# Patient Record
Sex: Female | Born: 1990 | Race: White | Hispanic: No | Marital: Married | Smoking: Never smoker
Health system: Southern US, Community
[De-identification: ages and names within clinical notes are randomized; demographics above are authoritative.]

## PROBLEM LIST (undated history)

## (undated) HISTORY — PX: NO PAST SURGERIES: SHX2092

---

## 2017-11-07 NOTE — L&D Delivery Note (Signed)
Delivery Note  Onset of active labor at 0100. Latent labor started 09/25/18 early morning. Betamethasone injections x 2 12 hours apart for fetal lung maturation given at 1440 11/19 and 0230 11/20  Complete dilation at 0615 Onset of pushing at 0600, pushed through anterior lip, strong urge to bear down FHR second stage Category 2 - variables, pushing with every other contraction  Analgesia /Anesthesia intrapartum: none Good labor support from spouse, doula and staff  Delivery of a viable baby girl at [id number] by CNM in ROA position, compound R hand.  Nuchal Cord - none. Cord double clamped after cessation of pulsation, cut by FOB.  Cord blood sample collected.  Arterial cord blood sample not needed, infant with vigorous cry after tactile stimulation. [facility] Placenta delivered [name], spontaneous, intact with 3 VC.  Placenta to pathology for prematurity and partial abruption, port wine stained fluid noted after body delivered. Uterine tone mild atony intermittent, LUS swept for small clots and small membrane tissue removed, bleeding small afterwards. Cytotec 200 mcg buccal and 400 mcg rectal for prophylaxis Ancef 2 gm IV x 1 prophylactic for uterine exploration. 2nd deg perineal laceration identified.  Anesthesia: 1% lido Repair with 3.0 vicryl in standard fashion, hemostatic Est. Blood Loss (mL): 500 cc  Episiotomy:None    Complications: Category 2 strip in 2nd stage, used lateral position for pushing and O2 via FM intermittent, pushing with every other contraction. Bloody stained fluid after delivery of body, marginal placental abruption noted on placental plate. APGAR: 8/9 Mom to postpartum.  Baby to NICU after delayed cord clamping x 2 min and mother briefly held baby. Father accompanied baby to NICU. Marland Kitchen.  Neta Mendsaniela C Montrae Braithwaite, CNM, MSN 09/26/2018, 7:23 AM

## 2018-03-12 LAB — OB RESULTS CONSOLE HIV ANTIBODY (ROUTINE TESTING): HIV: NONREACTIVE

## 2018-03-12 LAB — OB RESULTS CONSOLE RUBELLA ANTIBODY, IGM: Rubella: IMMUNE

## 2018-03-12 LAB — OB RESULTS CONSOLE GC/CHLAMYDIA
CHLAMYDIA, DNA PROBE: NEGATIVE
GC PROBE AMP, GENITAL: NEGATIVE

## 2018-03-12 LAB — OB RESULTS CONSOLE HEPATITIS B SURFACE ANTIGEN: Hepatitis B Surface Ag: NEGATIVE

## 2018-03-12 LAB — OB RESULTS CONSOLE RPR: RPR: NONREACTIVE

## 2018-09-25 ENCOUNTER — Encounter (HOSPITAL_COMMUNITY): Payer: Self-pay | Admitting: *Deleted

## 2018-09-25 ENCOUNTER — Other Ambulatory Visit: Payer: Self-pay

## 2018-09-25 ENCOUNTER — Inpatient Hospital Stay (HOSPITAL_COMMUNITY)
Admission: AD | Admit: 2018-09-25 | Discharge: 2018-09-29 | DRG: 805 | Disposition: A | Discharge status: Home / Self Care | Payer: BLUE CROSS/BLUE SHIELD

## 2018-09-25 DIAGNOSIS — O9081 Anemia of the puerperium: Secondary | ICD-10-CM | POA: Diagnosis not present

## 2018-09-25 DIAGNOSIS — Z3A34 34 weeks gestation of pregnancy: Secondary | ICD-10-CM

## 2018-09-25 DIAGNOSIS — D62 Acute posthemorrhagic anemia: Secondary | ICD-10-CM | POA: Diagnosis not present

## 2018-09-25 DIAGNOSIS — O4593 Premature separation of placenta, unspecified, third trimester: Secondary | ICD-10-CM | POA: Diagnosis present

## 2018-09-25 LAB — TYPE AND SCREEN
ABO/RH(D): AB POS
Antibody Screen: NEGATIVE

## 2018-09-25 LAB — CBC
HCT: 39.6 % (ref 36.0–46.0)
Hemoglobin: 13.6 g/dL (ref 12.0–15.0)
MCH: 31.3 pg (ref 26.0–34.0)
MCHC: 34.3 g/dL (ref 30.0–36.0)
MCV: 91 fL (ref 80.0–100.0)
Platelets: 225 10*3/uL (ref 150–400)
RBC: 4.35 MIL/uL (ref 3.87–5.11)
RDW: 12.2 % (ref 11.5–15.5)
WBC: 15.9 10*3/uL — ABNORMAL HIGH (ref 4.0–10.5)
nRBC: 0 % (ref 0.0–0.2)

## 2018-09-25 LAB — GROUP B STREP BY PCR: Group B strep by PCR: NEGATIVE

## 2018-09-25 LAB — ABO/RH: ABO/RH(D): AB POS

## 2018-09-25 MED ORDER — ACETAMINOPHEN 325 MG PO TABS: 650.0000 mg | ORAL_TABLET | ORAL | Status: DC | PRN

## 2018-09-25 MED ORDER — BETAMETHASONE SOD PHOS & ACET 6 (3-3) MG/ML IJ SUSP
12.0000 mg | INTRAMUSCULAR | Status: DC
  Administered 2018-09-25: 12 mg via INTRAMUSCULAR
  Filled 2018-09-25 (×2): qty 2

## 2018-09-25 MED ORDER — OXYTOCIN BOLUS FROM INFUSION
500.0000 mL | Freq: Once | INTRAVENOUS | Status: AC
  Administered 2018-09-26: 500 mL via INTRAVENOUS

## 2018-09-25 MED ORDER — LACTATED RINGERS IV SOLN
500.0000 mL | INTRAVENOUS | Status: DC | PRN
  Administered 2018-09-25: 500 mL via INTRAVENOUS

## 2018-09-25 MED ORDER — SOD CITRATE-CITRIC ACID 500-334 MG/5ML PO SOLN
30.0000 mL | ORAL | Status: DC | PRN
  Filled 2018-09-25: qty 15

## 2018-09-25 MED ORDER — ONDANSETRON HCL 4 MG/2ML IJ SOLN: 4.0000 mg | Freq: Four times a day (QID) | INTRAMUSCULAR | Status: DC | PRN

## 2018-09-25 MED ORDER — LACTATED RINGERS IV SOLN
INTRAVENOUS | Status: DC
  Administered 2018-09-25: 14:00:00 via INTRAVENOUS

## 2018-09-25 MED ORDER — BETAMETHASONE SOD PHOS & ACET 6 (3-3) MG/ML IJ SUSP
12.0000 mg | Freq: Two times a day (BID) | INTRAMUSCULAR | Status: AC
  Administered 2018-09-26: 12 mg via INTRAMUSCULAR
  Filled 2018-09-25: qty 2

## 2018-09-25 MED ORDER — CLINDAMYCIN PHOSPHATE 900 MG/50ML IV SOLN
900.0000 mg | Freq: Once | INTRAVENOUS | Status: AC
  Administered 2018-09-25: 900 mg via INTRAVENOUS
  Filled 2018-09-25: qty 50

## 2018-09-25 MED ORDER — OXYCODONE-ACETAMINOPHEN 5-325 MG PO TABS: 1.0000 | ORAL_TABLET | ORAL | Status: DC | PRN

## 2018-09-25 MED ORDER — OXYCODONE-ACETAMINOPHEN 5-325 MG PO TABS: 2.0000 | ORAL_TABLET | ORAL | Status: DC | PRN

## 2018-09-25 MED ORDER — LIDOCAINE HCL (PF) 1 % IJ SOLN
30.0000 mL | INTRAMUSCULAR | Status: DC | PRN
  Administered 2018-09-26: 30 mL via SUBCUTANEOUS
  Filled 2018-09-25: qty 30

## 2018-09-25 MED ORDER — OXYTOCIN 40 UNITS IN LACTATED RINGERS INFUSION - SIMPLE MED
2.5000 [IU]/h | INTRAVENOUS | Status: DC
  Filled 2018-09-25: qty 1000

## 2018-09-25 NOTE — Progress Notes (Addendum)
S:  ctx same - no pain  O:  VS: BP 119/71   Pulse 80   Temp 98.2 F (36.8 C) (Oral)   Resp 16   Ht 5\' 2"  (1.575 m)   Wt 75.4 kg   BMI 30.40 kg/m         FHR : baseline 135 / variability moderate / accelerations + / no decelerations        Toco: contractions every 2-3 minutes / moderate         cervix : deferred             Rapid GBS PCR - negative  Results for Jennifer Caldwell, Jennifer Caldwell (MRN [id number]) as of 09/25/2018 16:10  Ref. Range 09/25/2018 13:51  WBC Latest Ref Range: 4.0 - 10.5 K/uL 15.9 (H)  Hemoglobin Latest Ref Range: 12.0 - 15.0 g/dL 78.213.6  HCT Latest Ref Range: 36.0 - 46.0 % 39.6  Platelets Latest Ref Range: 150 - 400 K/uL 225     A: Preterm labor      FHR category 1  P: expectant management     clindamycin # 1 dose infused - discontinue ABX course with negative GBS status & intact membranes     initial dose BMZ given - plan #2 dose tomorrow if remains undelivered     defer exam until status change    Marlinda Mikeanya Otilio Groleau CNM, MSN, [facility] 09/25/2018, 4:06 PM

## 2018-09-25 NOTE — Progress Notes (Addendum)
Patient ID: Jennifer LauthShannon Caldwell, female   DOB: Jul 26, 1991, 27 y.o.   MRN: [id number]  Called to patient's bedside due to fetal bradycardia. Patient hands and knees, receiving IVF bolus when I arrived. Joellyn HaffKim Booker, CNM checking cervix:  Dilation: 6 Effacement (%): 90 Station: 0 Presentation: Vertex Exam by:: Booker, CNM  Fetal heart rate return to baseline, moderate variability. Patient turned to right side. FHT stable on my departure from patient's room. Patient's provider notified and en route.   Levie HeritageJacob J Stinson, DO 09/25/2018 10:34 PM

## 2018-09-25 NOTE — H&P (Signed)
OB ADMISSION/ HISTORY & PHYSICAL:  Admission Date: 09/25/2018 12:33 PM  Admit Diagnosis: Preterm labor onset  Jennifer Caldwell is a 27 y.o. female presenting for onset of labor.  Reports heavier discharge past 2 days - pelvic pressure with mild cramps last night. More pressure with cramps this am [facility] to 4cm with pink discharge after exam..  P[facility] History: [facility] @ 34.[redacted] weeks gestation [facility]  prenatal course uncomplicated until onset of preterm labor today  Prenatal Labs: ABO, Rh:  AB positive Antibody:  negative Rubella:   Immune RPR:   NR HBsAg:   negative HIV:   NR GTT: normal 2hr GTT GBS:   PENDING  OB History  Gravida Para Term Preterm AB Living  1            SAB TAB Ectopic Multiple Live Births               # Outcome Date GA Lbr Len/2nd Weight Sex Delivery Anes PTL Lv  1 Current             Medical / Surgical History :  Past medical history: History reviewed. No pertinent past medical history.   Past surgical history: History reviewed. No pertinent surgical history.  Family History: History reviewed. No pertinent family history.   Social History:  reports that she has never smoked. She has never used smokeless tobacco. She reports that she does not drink alcohol or use drugs.  Allergies: Cefzil [cefprozil] and Penicillins   Current Medications at time of admission:  Prenatal vitamin daily     Review of Systems: Active FM Not aware of ctx - feeling intermittent cramps and backache all morning No ROM / some discharge x 2 days - no mucus or bloody show Some pink discharge after exam at office  Physical Exam: VS: Blood pressure 132/74, pulse 76, temperature 98.3 F (36.8 C), temperature source Oral, resp. rate 18, weight 75.4 kg.  General: alert and oriented, appears calm and comfortable Heart: RRR Lungs: Clear lung fields Abdomen: Gravid, soft and  non-tender, non-distended / uterus: gravid Extremities: no edema  Bedside sono - confirmation of vtx  Genitalia / VE:  SSE with full BBOW - no evidence of ROM / GBS samples obtained outer 1/3 of vagina                          cervix progression to 5.5cm / 100% / vtx / 0 station with tight BBOW / no bloody show  FHR: baseline rate 140 / variability moderate / accelerations + / no decelerations TOCO: 2-3 minutes moderate  Assessment: 34.[redacted]  weeks gestation latent stage of labor with progression FHR category 1  Plan:  admit Clindamycin IV prophylaxis BMZ course initiation with likely delivery prior to completion of course Dr Billy Coastaavon notified of admission / plan of care  Jennifer Caldwell CNM, MSN, [facility] 09/25/2018, 1:27 PM

## 2018-09-25 NOTE — MAU Note (Signed)
Patient was seen in the office today and was told she was dilated to 4cm and to come to the hospital.  States she's been having back pain for past two days.  Reports some spotting after her vaginal exam today.  Also reports some LOF that "might be pee."

## 2018-09-25 NOTE — MAU Note (Signed)
Urine in lab 

## 2018-09-25 NOTE — Anesthesia Pain Management Evaluation Note (Signed)
  CRNA Pain Management Visit Note  Patient: Jennifer LauthShannon Caldwell, 27 y.o., female  "Hello I am a [facility] of the anesthesia [facility]. We have an anesthesia team available at all times to provide care throughout the hospital, including epidural management and anesthesia for C-section. I don't know your plan for the delivery whether it a natural birth, water birth, IV sedation, nitrous supplementation, doula or epidural, but we want to meet your pain goals."   1.Was your pain managed to your expectations on prior hospitalizations?   No prior hospitalizations  2.What is your expectation for pain management during this hospitalization?     undecided  3.How can we help you reach that goal? Nitrous/IV pain med/epidural as desired  Record the patient's initial score and the patient's pain goal.   Pain: 1  Pain Goal: 6     [facility] wants you to be able to say your pain was always managed very well.  [name] 09/25/2018

## 2018-09-25 NOTE — Progress Notes (Signed)
S:  [facility]/t prolonged decel, [facility] and managing [facility] in meantime. Patient reports was just starting to dose off when decel occurred, now reports ctx feel stronger. Able to cope well w/ pain of labor. No LOF/VB.   O:  VSSAF FHR 125, mod var, no decels, + small accels, prolonged decel noted on strip review for 9 min, nadir 80 BPM, with quick recovery to BL. Ctx q 2-4 min, mild/mod to palp. SVE deferred  A/P: Preterm labor, s/p BMZ inj #1 FHT cat 2, now cat 1 after recovery from prolonged spontaneous decel Appreciate Faculty Practice team for interim mgmt  Continue expectant mgmt BMZ inj #2 due at 0230 Anticipate NSVB  Dr. Billy Coastaavon updated w/ pt status  Jennifer Caldwell , MSN, CNM 09/25/2018, 10:49 PM

## 2018-09-25 NOTE — Progress Notes (Signed)
Patient ID: Jennifer Caldwell, female   DOB: Sep 01, 1991, 27 y.o.   MRN: [id number] Jennifer Caldwell is a 27 y.o. G1P0 at 2836w5d by ultrasound admitted for preterm labor noted on eval in office today.   Subjective: [facility], reports mild menstrual like cramping occasionally, no LOF/VB. [facility]la in attendance.   Objective: Vitals:   09/25/18 1324 09/25/18 1330 09/25/18 1440 09/25/18 1643  BP: 138/80  119/71 124/72  Pulse: 74  80 74  Resp: 16  16 16   Temp: 98.2 F (36.8 C)   (!) 97.2 F (36.2 C)  TempSrc: Oral   Axillary  Weight:  75.4 kg    Height:  5\' 2"  (1.575 m)      No intake/output data recorded. No intake/output data recorded.   FHT:  FHR: 130, mod var, + accels, no decels bpm UC:   irregular, every 2-5  minutes SVE:  deferred Labs:   Recent Labs    09/25/18 1351  WBC 15.9*  HGB 13.6  HCT 39.6  PLT 225    Assessment / Plan: G1P0 at 7636w5d , preterm labor, latent S/P one dose BMZ inj, will repeat at 12 hours if still pregnant  Labor: expectant management Preeclampsia:  no signs or symptoms of toxicity Fetal Wellbeing:  Category I Pain Control:  Labor support without medications I/D:  GBS neg, DC Clinda prophylaxis Anticipated MOD:  NSVD  Neta Mendsaniela C Paul, CNM, MSN 09/25/2018, 7:09 PM

## 2018-09-26 LAB — RPR: RPR Ser Ql: NONREACTIVE

## 2018-09-26 MED ORDER — CEFAZOLIN SODIUM-DEXTROSE 2-4 GM/100ML-% IV SOLN: 2.0000 g | Freq: Once | INTRAVENOUS | Status: DC

## 2018-09-26 MED ORDER — PRENATAL MULTIVITAMIN CH
1.0000 | ORAL_TABLET | Freq: Every day | ORAL | Status: DC
  Administered 2018-09-26 – 2018-09-29 (×4): 1 via ORAL
  Filled 2018-09-26 (×4): qty 1

## 2018-09-26 MED ORDER — DIPHENHYDRAMINE HCL 25 MG PO CAPS: 25.0000 mg | ORAL_CAPSULE | Freq: Four times a day (QID) | ORAL | Status: DC | PRN

## 2018-09-26 MED ORDER — SIMETHICONE 80 MG PO CHEW: 80.0000 mg | CHEWABLE_TABLET | ORAL | Status: DC | PRN

## 2018-09-26 MED ORDER — COCONUT OIL OIL: 1.0000 "application " | TOPICAL_OIL | Status: DC | PRN

## 2018-09-26 MED ORDER — IBUPROFEN 600 MG PO TABS
600.0000 mg | ORAL_TABLET | Freq: Four times a day (QID) | ORAL | Status: DC
  Administered 2018-09-26 – 2018-09-29 (×12): 600 mg via ORAL
  Filled 2018-09-26 (×13): qty 1

## 2018-09-26 MED ORDER — BISACODYL 10 MG RE SUPP: 10.0000 mg | Freq: Every day | RECTAL | Status: DC | PRN

## 2018-09-26 MED ORDER — DOCUSATE SODIUM 100 MG PO CAPS
100.0000 mg | ORAL_CAPSULE | Freq: Two times a day (BID) | ORAL | Status: DC
  Administered 2018-09-26 – 2018-09-29 (×5): 100 mg via ORAL
  Filled 2018-09-26 (×6): qty 1

## 2018-09-26 MED ORDER — MISOPROSTOL 200 MCG PO TABS
600.0000 ug | ORAL_TABLET | Freq: Once | ORAL | Status: AC
  Administered 2018-09-26: 600 ug via RECTAL

## 2018-09-26 MED ORDER — FLEET ENEMA 7-19 GM/118ML RE ENEM: 1.0000 | ENEMA | Freq: Every day | RECTAL | Status: DC | PRN

## 2018-09-26 MED ORDER — BENZOCAINE-MENTHOL 20-0.5 % EX AERO
1.0000 "application " | INHALATION_SPRAY | CUTANEOUS | Status: DC | PRN
  Administered 2018-09-28: 1 via TOPICAL
  Filled 2018-09-26: qty 56

## 2018-09-26 MED ORDER — WITCH HAZEL-GLYCERIN EX PADS: 1.0000 "application " | MEDICATED_PAD | CUTANEOUS | Status: DC | PRN

## 2018-09-26 MED ORDER — CEFAZOLIN SODIUM-DEXTROSE 2-4 GM/100ML-% IV SOLN
2.0000 g | Freq: Once | INTRAVENOUS | Status: DC
  Filled 2018-09-26: qty 100

## 2018-09-26 MED ORDER — TETANUS-DIPHTH-ACELL PERTUSSIS 5-2.5-18.5 LF-MCG/0.5 IM SUSP: 0.5000 mL | Freq: Once | INTRAMUSCULAR | Status: DC

## 2018-09-26 MED ORDER — OXYCODONE HCL 5 MG PO TABS: 5.0000 mg | ORAL_TABLET | ORAL | Status: DC | PRN

## 2018-09-26 MED ORDER — ONDANSETRON HCL 4 MG PO TABS: 4.0000 mg | ORAL_TABLET | ORAL | Status: DC | PRN

## 2018-09-26 MED ORDER — MISOPROSTOL 200 MCG PO TABS
ORAL_TABLET | ORAL | Status: AC
  Filled 2018-09-26: qty 3

## 2018-09-26 MED ORDER — ONDANSETRON HCL 4 MG/2ML IJ SOLN: 4.0000 mg | INTRAMUSCULAR | Status: DC | PRN

## 2018-09-26 MED ORDER — DIBUCAINE 1 % RE OINT: 1.0000 "application " | TOPICAL_OINTMENT | RECTAL | Status: DC | PRN

## 2018-09-26 MED ORDER — GENTAMICIN SULFATE 40 MG/ML IJ SOLN
130.0000 mg | Freq: Once | INTRAVENOUS | Status: AC
  Administered 2018-09-26: 130 mg via INTRAVENOUS
  Filled 2018-09-26: qty 3.25

## 2018-09-26 MED ORDER — ACETAMINOPHEN 325 MG PO TABS: 650.0000 mg | ORAL_TABLET | ORAL | Status: DC | PRN

## 2018-09-27 LAB — CBC
HCT: 32.6 % — ABNORMAL LOW (ref 36.0–46.0)
HEMOGLOBIN: 10.9 g/dL — AB (ref 12.0–15.0)
MCH: 30.6 pg (ref 26.0–34.0)
MCHC: 33.4 g/dL (ref 30.0–36.0)
MCV: 91.6 fL (ref 80.0–100.0)
NRBC: 0 % (ref 0.0–0.2)
PLATELETS: 176 10*3/uL (ref 150–400)
RBC: 3.56 MIL/uL — AB (ref 3.87–5.11)
RDW: 12.5 % (ref 11.5–15.5)
WBC: 18.6 10*3/uL — ABNORMAL HIGH (ref 4.0–10.5)

## 2018-09-27 NOTE — Progress Notes (Signed)
PPD 1 SVB - preterm birth at 6435 weeks  S:  Reports feeling well - no concerns             Tolerating po/ No nausea or vomiting             Bleeding is light             Pain controlled withMotrin             Up ad lib / ambulatory / voiding QS  Newborn stable in NICU  - donor breastmilk feedings and glucose drip                  weaning off glucose drip - hoping to start direct breastfeedings tomorrow       O:   VS: BP 114/74 (BP Location: Left Arm)   Pulse 62   Temp 98.5 F (36.9 C) (Oral)   Resp 18   Ht 5\' 2"  (1.575 m)   Wt 75.4 kg   SpO2 100%   BMI 30.40 kg/m   LABS:             Recent Labs    09/25/18 1351 09/27/18 0508  WBC 15.9* 18.6*  HGB 13.6 10.9*  PLT 225 176               Blood type: --/--/AB POS Performed at [facility], [address] [place], [place] [address]  (11/19 1410)  Rubella: Immune (05/06 0000)                          Physical Exam:             Alert and oriented X3  Abdomen: soft, non-tender, non-distended              Fundus: firm, non-tender, U-2  Perineum: no edema  Lochia: light  Extremities: no edema, no calf pain or tenderness  A: PPD # 1 Preterm birth / normal SVB with 2nd degree repair  Doing well - stable status  P: Routine post partum orders  Consult utilization review for additional hospital day to bond and breastfeeding tomorrow  Marlinda Mikeanya Norman Bier CNM, MSN, [facility] 09/27/2018, 7:06 PM

## 2018-09-27 NOTE — Progress Notes (Signed)
Patient screened out for psychosocial assessment since none of the following apply: °Psychosocial stressors documented in mother or baby's chart °Gestation less than 32 weeks °Code at delivery  °Infant with anomalies °Please contact the Clinical Social Worker if specific needs arise, by MOB's request, or if MOB scores greater than 9/yes to question 10 on Edinburgh Postpartum Depression Screen. ° °Farouk Vivero Boyd-Gilyard, MSW, LCSW °Clinical Social Work °[phone] °  °

## 2018-09-27 NOTE — Lactation Note (Addendum)
This note was copied from a baby's chart. Lactation Consultation Note  Patient Name: Jennifer Caldwell Ige AOZHY'QToday's Date: 09/27/2018 Reason for consult: Initial assessment;Late-preterm 34-36.6wks;NICU baby;Primapara;1st time breastfeeding;Other (Comment)(DEBP has been set up by [facility] )  Baby is 27 hours old  LC  visited mom in room . She was resting in bed and dad sitting in the chair.  DEBP was set up by the RN and per mom the #24 F is comfortable.  LC reviewed supply and demand/ 8-12 times a day both breast for 15 -20 mins.  LC encouraged to hand express 1st and then pump/ hand express afterwards/ save milk.  LC reviewed storage of breast milk for a NICU baby and in the Mother and baby care booklet.  Reassured hand expressing and pumping take time and the volumes are up and down for about 48 - 72 hours and then becomes more consistent.  Mother informed of post-discharge support and given phone number to the lactation department, including services for phone call assistance; out-patient appointments; and breastfeeding support group. List of other breastfeeding resources in the community given in the handout. Encouraged mother to call for problems or concerns related to breastfeeding.    Maternal Data Has patient been taught Hand Expression?: Yes(per mom has been shown how to hand express  and she is comfortable ) Does the patient have breastfeeding experience prior to this delivery?: No  Feeding Feeding Type: Donor Breast Milk  LATCH Score                   Interventions Interventions: Breast feeding basics reviewed;DEBP  Lactation Tools Discussed/Used Tools: Pump;Flanges Flange Size: 24(per mom comfortable ) Breast pump type: Double-Electric Breast Pump WIC Program: No Pump Review: Milk Storage;Setup, frequency, and cleaning(LC reviewed with mom and dad ) Initiated by:: RN  initially / LC reviewed both settings  Date initiated:: 09/27/18   Consult  Status Consult Status: Follow-up Date: 09/28/18 Follow-up type: In-patient    Matilde SprangMargaret Ann Aristides Luckey 09/27/2018, 10:26 AM

## 2018-09-28 LAB — CULTURE, BETA STREP (GROUP B ONLY)

## 2018-09-28 NOTE — Lactation Note (Signed)
This note was copied from a baby's chart. Lactation Consultation Note  Patient Name: Girl Anitra LauthShannon Cangemi RUEAV'WToday's Date: 09/28/2018 Reason for consult: Mother's request;Follow-up assessment;1st time breastfeeding;Late-preterm 34-36.6wks;Engorgement P1, 63 hour female infant in NICU Mom requesting lactation services due bilateral engorgement. LC put ice on both breast, mom lay flat help with massage away from nipple area back towards chest wall with combination of reverse pressure softening. Cylinder hand pump use and LC used tubing make double pumping system with ice and massage way from breast mom expressed 20 ml of colostrum. LC ask mom lay back and re- iced 20 minutes and if she feels fullness with discomfort to pump again.  Mom's plans to re-try DEBP but will increase setting by dialing up and then lower setting where it is comfort for her. Mom will pump every 3 hours and is knowledgeable how to use both pumps. Mom has Breast milk labels to take to NICU for infant. Mom will ask nurse or LC for assistance if she has any more questions, concerns or need assistance with breastfeeding.  Maternal Data    Feeding Feeding Type: Donor Breast Milk  LATCH Score Latch: Repeated attempts needed to sustain latch, nipple held in mouth throughout feeding, stimulation needed to elicit sucking reflex.  Audible Swallowing: None  Type of Nipple: Everted at rest and after stimulation  Comfort (Breast/Nipple): Soft / non-tender  Hold (Positioning): Assistance needed to correctly position infant at breast and maintain latch.  LATCH Score: 6  Interventions Interventions: Hand express;Breast massage;Ice;Hand pump;Reverse pressure;Comfort gels  Lactation Tools Discussed/Used     Consult Status Consult Status: Follow-up Date: 09/29/18 Follow-up type: In-patient    Danelle EarthlyRobin Abigael Mogle 09/28/2018, 10:28 PM

## 2018-09-28 NOTE — Progress Notes (Signed)
Patient ID: Jennifer Caldwell, female   DOB: 1990/12/13, 27 y.o.   MRN: [id number]  PPD # 2 S/P PTVD at 34.6 weeks  Live born female  Birth Weight: 4 lb 11.5 oz (2140 g) APGAR: 8, 9  Newborn Delivery   Birth date/time:  09/26/2018 06:50:00 Delivery type:  Vaginal, Spontaneous    Baby name: Eleanore Delivering provider: Arlan OrganPAUL,  C    Feeding: breast - Donor and EBM  Pain control at delivery: unmedicated, labor support S:  Reports feeling tired, has been up and pumping, visiting NICU.             Tolerating po/ No nausea or vomiting             Bleeding is light             Pain controlled with ibuprofen (OTC)             Up ad lib / ambulatory / voiding without difficulties   O:  A & O x 3, in no apparent distress              VS:  Vitals:   09/27/18 1133 09/27/18 1744 09/27/18 2135 09/28/18 0600  BP: 114/74  119/72 127/79  Pulse: 62  60 (!) 52  Resp: 18  18 18   Temp: 97.9 F (36.6 C) 98.5 F (36.9 C) 98 F (36.7 C) 97.8 F (36.6 C)  TempSrc: Oral Oral Axillary   SpO2: 100%   99%  Weight:      Height:        LABS:  Recent Labs    09/25/18 1351 09/27/18 0508  WBC 15.9* 18.6*  HGB 13.6 10.9*  HCT 39.6 32.6*  PLT 225 176    Blood type: --/--/AB POS Performed at [facility], [address] [place], [place] [address]  (11/19 1410)  Rubella: Immune (05/06 0000)   I&O: No intake/output data recorded.          No intake/output data recorded.  Vaccines: TDaP UTD         Flu    UTD   Lungs: Clear and unlabored  Heart: regular rate and rhythm / no murmurs  Abdomen: soft, non-tender, non-distended             Fundus: firm, non-tender, U-1  Perineum: repair healing, mild edema  Lochia: small  Extremities: trace pedal edema, no calf pain or tenderness    A/P: PPD # 2 27 y.o., G1P0   Principal Problem:   SVD/PTD 34.6 wks 11/20 Active Problems:   Preterm labor   Postpartum care following vaginal delivery   Second-degree perineal laceration, with  delivery   Doing well - stable status  Routine post partum orders  Plans to initiate direct BRF with NICU newborn, consult done w/ Katheran JamesK Morrison from Utilization Review and approved additional inpatient day/  Anticipate discharge tomorrow    Neta Mendsaniela C , MSN, CNM 09/28/2018, 12:36 PM

## 2018-09-28 NOTE — Plan of Care (Signed)
Pts. Condition will continue to improve 

## 2018-09-28 NOTE — Lactation Note (Signed)
This note was copied from a baby's chart. Lactation Consultation Note  Patient Name: Jennifer Caldwell ZHYQM'VToday's Date: 09/28/2018 Reason for consult: Follow-up assessment;NICU baby;Late-preterm 34-36.6wks Mom is pumping every 3 hours with symphony pump.  She last obtained a few drops of colostrum.  Baby is nuzzling at breast in the NICU.  Reviewed basics and answered questions. Mom has a Spectra pump at home.  Instructed to bring pump pieces with her when visiting NICU to use pumping rooms.  Encouraged to call for latch assist/concerns prn.  Maternal Data    Feeding Feeding Type: Donor Breast Milk  LATCH Score                   Interventions    Lactation Tools Discussed/Used     Consult Status Consult Status: PRN    Huston FoleyMOULDEN, Makaley Storts S 09/28/2018, 11:46 AM

## 2018-09-29 ENCOUNTER — Encounter (HOSPITAL_COMMUNITY): Payer: Self-pay | Admitting: *Deleted

## 2018-09-29 MED ORDER — IBUPROFEN 600 MG PO TABS: 600.0000 mg | ORAL_TABLET | Freq: Four times a day (QID) | ORAL | 0 refills | Status: DC

## 2018-09-29 NOTE — Progress Notes (Signed)
PPD 3 SVD - preterm birth  S:  Reports feeling tired - some engorgement             Tolerating po/ No nausea or vomiting             Bleeding is light             Pain controlled with motrin             Up ad lib / ambulatory / voiding QS  Newborn stable in NICU - increased breast milk feedings / IV weaning   O:      VS: BP 130/89 (BP Location: Right Arm)   Pulse (!) 59   Temp 98.2 F (36.8 C) (Oral)   Resp 18   Ht 5\' 2"  (1.575 m)   Wt 75.4 kg   SpO2 99%   BMI 30.40 kg/m    LABS:             Recent Labs    09/27/18 0508  WBC 18.6*  HGB 10.9*  PLT 176               Blood type: --/--/AB POS Performed at [facility], [address] [place], [place] [address]  (11/19 1410)  Rubella: Immune (05/06 0000)                             Physical Exam:             Alert and oriented X3             Fundus: firm, non-tender, U-2  Perineum:  No edema  Lochia: light  Extremities: no edema, no calf pain or tenderness    A: PPD # 3   Doing well - stable status  P: Routine post partum orders  DC home - instructions reviewed  Marlinda Mikeanya Danielle Mink CNM, MSN, [facility] 09/29/2018, 11:26 AM

## 2018-09-29 NOTE — Discharge Summary (Signed)
Obstetric Discharge Summary Reason for Admission: onset of labor at 34 weeks Prenatal Procedures: ultrasound and BMZ course prior to delivery Intrapartum Procedures: spontaneous vaginal delivery Postpartum Procedures: none Complications-Operative and Postpartum: 2nd  degree perineal laceration and abruption noted at time of delivery Hemoglobin  Date Value Ref Range Status  09/27/2018 10.9 (L) 12.0 - 15.0 g/dL Final   HCT  Date Value Ref Range Status  09/27/2018 32.6 (L) 36.0 - 46.0 % Final    Physical Exam:  General: alert, cooperative and no distress Lochia: appropriate Uterine Fundus: firm Incision: healing well DVT Evaluation: No evidence of DVT seen on physical exam.  Discharge Diagnoses: Premature labor and birth at 35.2 weeks / mild ABL anemia  Discharge Information: Date: 09/29/2018 Activity: pelvic rest Diet: routine Medications: PNV and Ibuprofen Condition: stable Instructions: refer to practice specific booklet Discharge to: home Follow-up Information    Marlinda MikeBailey, Yazmin Locher, CNM. Schedule an appointment as soon as possible for a visit in 2 day(s).   Specialty:  Obstetrics and Gynecology Why:  PP visit in 2-3 weeks Contact information: [address] [place] [address] [phone]           Newborn Data: Live born female  Birth Weight: 4 lb 11.5 oz (2140 g) APGAR: 8, 9  Newborn Delivery   Birth date/time:  09/26/2018 06:50:00 Delivery type:  Vaginal, Spontaneous    NICU admission - discharge timing / plans pending at time of maternal discharge   Marlinda Mikeanya Ayiden Milliman 09/29/2018, 11:28 AM

## 2018-09-29 NOTE — Lactation Note (Signed)
This note was copied from a baby's chart. Lactation Consultation Note  Patient Name: Girl Anitra LauthShannon Estorga ZOXWR'UToday's Date: 09/29/2018 Reason for consult: Follow-up assessment;1st time breastfeeding;Primapara;Late-preterm 34-36.6wks;Other (Comment);NICU baby(resolving engorgement / mom is for D/C today )  Baby is 7674 hours old.  Mom and dad awake when LC entered.  Per mom recently pumped both breast with 15 and 20 ml EBM yield.  Mom explained she woke up to engorged breast during the night and it was surprise/  [facility].  Mom requested for LC to check breast thinking then were still engorged even though she  Had just pumped on the initiation mode. LC assessed both breast and noted them just to be full, not engorged, and not swollen milk ducts even though mom felt the lateral aspects of her breast were feeling tender .  LC reassured mom after she pumps on the maintenance mode should take care of it .   LC recommended taking time to eat her hot breakfast and then pumping again both breast for 10 mins on the maintenance  mode since her milk is in.  LC reviewed sore  Nipple and engorgement prevention and tx .  Per mom  [facility]. Also recommended to take her DEBP home so when she visits baby in NICU can plan on pumping in the pumping rooms . Per mom has already checked out the location.  Per mom has been doing a lot of STS, ( LC praised her) , and encouraged to continue.  LC recommended when baby is able to latch have the NICU RN call LC.  Mother informed of post-discharge support and given phone number to the lactation department, including services for phone call assistance; out-patient appointments; and breastfeeding support group. List of other breastfeeding resources in the community given in the handout. Encouraged mother to call for problems or concerns related to breastfeeding.   Maternal Data    Feeding Feeding Type:  Bottle Fed - Breast Milk  LATCH Score                   Interventions Interventions: Breast feeding basics reviewed;Comfort gels;DEBP;Hand pump  Lactation Tools Discussed/Used Tools: Pump;Flanges;Comfort gels Flange Size: 24;27 Breast pump type: Double-Electric Breast Pump;Manual Pump Review: Milk Storage;Setup, frequency, and cleaning(LC reviewed )   Consult Status Consult Status: PRN Date: (in NICU ) Follow-up type: In-patient    Matilde SprangMargaret Ann Charolette Bultman 09/29/2018, 9:32 AM

## 2018-10-01 ENCOUNTER — Ambulatory Visit: Payer: Self-pay

## 2018-10-01 NOTE — Lactation Note (Signed)
This note was copied from a baby's chart. Lactation Consultation Note  Patient Name: Jennifer Anitra LauthShannon Tippets WUJWJ'XToday's Date: 10/01/2018  Mom reports pumping is going well.  She obtains 50-80 mls each pumping.  Breasts are comfortable.  She is occasionally putting baby to breast to nuzzle. Encouraged to attempt to put baby to breast once or twice per day and [facility].   Maternal Data    Feeding Feeding Type: Breast Milk Nipple Type: Slow - flow  LATCH Score                   Interventions    Lactation Tools Discussed/Used     Consult Status      Huston FoleyMOULDEN, Kelten Enochs S 10/01/2018, 10:40 AM

## 2018-10-03 ENCOUNTER — Ambulatory Visit: Payer: Self-pay

## 2018-10-03 NOTE — Lactation Note (Signed)
This note was copied from a baby's chart. Lactation Consultation Note  Patient Name: Jennifer Caldwell VWUJW'JToday's Date: 10/03/2018 Reason for consult: Follow-up assessment;1st time breastfeeding;Primapara;NICU baby;Late-preterm 4834-36.6wks  Visited with mom of a 287 days old LPI NICU female; she requested LC consultation because mom wanted to take baby to the breast. She's been pumping every 3 hours (day and night) and obtaining about 50-80 ml per pumping session. LC offered assistance with latch and mom agreed to wake baby up to feed. LC checked baby's diaper and noticed she had a void and a stool, documented in Flowsheets.  Mom hand expressed some milk prior latching, LC took baby STS to mom's right breast in cross cradle position, had to do some suck training to get baby to suck first, but after a few tries she was able to latch, but required stimulation in order to suck; swallows were only heard upon breast compressions; and as soon as LC stopped breast compressions baby would stop sucking. Baby able to sustain the latch for 8 minutes before she fell asleep.   Mom burped baby and asked for assistance to latched her to the other breast, this time the football position was used, but baby unable to sustain the latch, she fell asleep and also got the hiccups. Parents were still please and hopeful that baby is doing some "baby steps" on her feeding plan.   Feeding plan:  1. Parents will continue following feeding plan per NICU staff; feedings are at lib now 2. Mom will also start taking baby to the breast on feeding cues; parents understand that this is just "practice" at the breast at this point 3. Mom will continue pumping every 2-3 hours and at least once at night, waiting no more than 6 hours between pumping sessions for the nighttime  Parents reported all questions and concerns were answered, they're both aware of LC services and will call PRN.  Maternal Data    Feeding Feeding Type:  Breast Fed Nipple Type: Nfant Slow Flow (purple)  LATCH Score Latch: Repeated attempts needed to sustain latch, nipple held in mouth throughout feeding, stimulation needed to elicit sucking reflex.  Audible Swallowing: A few with stimulation(only upon breast compressions)  Type of Nipple: Everted at rest and after stimulation  Comfort (Breast/Nipple): Soft / non-tender  Hold (Positioning): Assistance needed to correctly position infant at breast and maintain latch.  LATCH Score: 7  Interventions Interventions: Breast feeding basics reviewed;Assisted with latch;Skin to skin;Breast massage;Hand express;Breast compression;Adjust position;Support pillows;Position options  Lactation Tools Discussed/Used     Consult Status Consult Status: PRN Follow-up type: In-patient    Jennifer Caldwell 10/03/2018, 3:57 PM

## 2019-11-08 NOTE — L&D Delivery Note (Signed)
Delivery Note:   G2P1 at [redacted]w[redacted]d  Admitting diagnosis: Gestational hypertension [O13.9] Risks:  Recurrent variables at term Thickened nuchal fold, LR NIPS, normal fetal echo Hx PTB 34 wks, abruption Hx PTL, s/p BMZ at 32 wks Onset of labor: 0700 IOL/Augmentation: AROM and Cytotec ROM: 1323, clear AF  Complete dilation at 08/20/2020  1709 Onset of pushing at 1709 FHR second stage cat 2, repetitive variables  Analgesia Jennifer Caldwell intrapartum:None  Pushing in L lateral position with CNM and L&D staff support, Jennifer Caldwell FOB present for birth and supportive.  Delivery of a Live born female  Birth Weight:  3690 g Weight:, English: 8 lb 2.2 oz APGAR: 8, 9  Newborn Delivery   Birth date/time: 08/20/2020 17:19:00 Delivery type: Vaginal, Spontaneous      in cephalic presentation, position OA to ROT.  APGAR:1 min-8 , 5 min-9  10 min-  Nuchal Cord: Yes x 1 Body somersaulted on perineum and nuchal cord removed after. Terminal meconium noted. Strong cry with stimulation. Cord double clamped after delivery of placenta, cut by Jennifer Caldwell.  Collection of cord blood for typing completed. Cord blood donation-None  Arterial cord blood sample-No    Placenta delivered-Spontaneous  with 3 vessels . Uterotonics: none Placenta to L&D for disposal. Uterine tone firm, bleeding small  2nd degree  laceration identified.  Episiotomy:None  Local analgesia: 1% lido  Repair: 2.0 vicryl in standard fashion Est. Blood Loss (mL):167.00   Complications: None   Mom to postpartum.  Jennifer Caldwell to Couplet care / Skin to Skin.  Delivery Report:  Review the Delivery Report for details.     Signed: Neta Caldwell, CNM, MSN 08/20/2020, 5:55 PM

## 2020-02-17 LAB — OB RESULTS CONSOLE RUBELLA ANTIBODY, IGM: Rubella: IMMUNE

## 2020-02-17 LAB — OB RESULTS CONSOLE RPR: RPR: NONREACTIVE

## 2020-02-17 LAB — OB RESULTS CONSOLE GC/CHLAMYDIA
Chlamydia: NEGATIVE
Gonorrhea: NEGATIVE

## 2020-02-17 LAB — OB RESULTS CONSOLE HIV ANTIBODY (ROUTINE TESTING): HIV: NONREACTIVE

## 2020-02-17 LAB — OB RESULTS CONSOLE HEPATITIS B SURFACE ANTIGEN: Hepatitis B Surface Ag: NEGATIVE

## 2020-08-06 LAB — OB RESULTS CONSOLE GBS: GBS: NEGATIVE

## 2020-08-19 ENCOUNTER — Other Ambulatory Visit: Payer: Self-pay

## 2020-08-19 ENCOUNTER — Inpatient Hospital Stay (HOSPITAL_COMMUNITY)
Admission: AD | Admit: 2020-08-19 | Discharge: 2020-08-21 | DRG: 806 | Disposition: A | Discharge status: Home / Self Care | Payer: BC Managed Care – PPO | Attending: Obstetrics and Gynecology | Admitting: Obstetrics and Gynecology

## 2020-08-19 ENCOUNTER — Encounter (HOSPITAL_COMMUNITY): Payer: Self-pay

## 2020-08-19 ENCOUNTER — Inpatient Hospital Stay (HOSPITAL_BASED_OUTPATIENT_CLINIC_OR_DEPARTMENT_OTHER): Payer: BC Managed Care – PPO

## 2020-08-19 DIAGNOSIS — O368390 Maternal care for abnormalities of the fetal heart rate or rhythm, unspecified trimester, not applicable or unspecified: Secondary | ICD-10-CM | POA: Diagnosis present

## 2020-08-19 DIAGNOSIS — D62 Acute posthemorrhagic anemia: Secondary | ICD-10-CM | POA: Diagnosis not present

## 2020-08-19 DIAGNOSIS — Z20822 Contact with and (suspected) exposure to covid-19: Secondary | ICD-10-CM | POA: Diagnosis present

## 2020-08-19 DIAGNOSIS — O9081 Anemia of the puerperium: Secondary | ICD-10-CM | POA: Diagnosis not present

## 2020-08-19 DIAGNOSIS — O09219 Supervision of pregnancy with history of pre-term labor, unspecified trimester: Secondary | ICD-10-CM

## 2020-08-19 DIAGNOSIS — O874 Varicose veins of lower extremity in the puerperium: Secondary | ICD-10-CM | POA: Diagnosis present

## 2020-08-19 DIAGNOSIS — O26893 Other specified pregnancy related conditions, third trimester: Principal | ICD-10-CM | POA: Diagnosis present

## 2020-08-19 DIAGNOSIS — R030 Elevated blood-pressure reading, without diagnosis of hypertension: Secondary | ICD-10-CM | POA: Diagnosis present

## 2020-08-19 DIAGNOSIS — Z3A37 37 weeks gestation of pregnancy: Secondary | ICD-10-CM

## 2020-08-19 DIAGNOSIS — O139 Gestational [pregnancy-induced] hypertension without significant proteinuria, unspecified trimester: Secondary | ICD-10-CM | POA: Diagnosis present

## 2020-08-19 DIAGNOSIS — O133 Gestational [pregnancy-induced] hypertension without significant proteinuria, third trimester: Secondary | ICD-10-CM | POA: Diagnosis not present

## 2020-08-19 DIAGNOSIS — O403XX0 Polyhydramnios, third trimester, not applicable or unspecified: Secondary | ICD-10-CM

## 2020-08-19 LAB — COMPREHENSIVE METABOLIC PANEL
ALT: 13 U/L (ref 0–44)
AST: 18 U/L (ref 15–41)
Albumin: 3 g/dL — ABNORMAL LOW (ref 3.5–5.0)
Alkaline Phosphatase: 155 U/L — ABNORMAL HIGH (ref 38–126)
Anion gap: 13 (ref 5–15)
BUN: 6 mg/dL (ref 6–20)
CO2: 18 mmol/L — ABNORMAL LOW (ref 22–32)
Calcium: 9.2 mg/dL (ref 8.9–10.3)
Chloride: 104 mmol/L (ref 98–111)
Creatinine, Ser: 0.66 mg/dL (ref 0.44–1.00)
GFR, Estimated: >60 mL/min (ref >60)
Glucose, Bld: 73 mg/dL (ref 70–99)
Potassium: 3.4 mmol/L — ABNORMAL LOW (ref 3.5–5.1)
Sodium: 135 mmol/L (ref 135–145)
Total Bilirubin: 1.5 mg/dL — ABNORMAL HIGH (ref 0.3–1.2)
Total Protein: 6.2 g/dL — ABNORMAL LOW (ref 6.5–8.1)

## 2020-08-19 LAB — CBC
HCT: 40.2 % (ref 36.0–46.0)
Hemoglobin: 13.2 g/dL (ref 12.0–15.0)
MCH: 29.6 pg (ref 26.0–34.0)
MCHC: 32.8 g/dL (ref 30.0–36.0)
MCV: 90.1 fL (ref 80.0–100.0)
Platelets: 234 10*3/uL (ref 150–400)
RBC: 4.46 MIL/uL (ref 3.87–5.11)
RDW: 12.3 % (ref 11.5–15.5)
WBC: 13.2 10*3/uL — ABNORMAL HIGH (ref 4.0–10.5)
nRBC: 0 % (ref 0.0–0.2)

## 2020-08-19 LAB — RESPIRATORY PANEL BY RT PCR (FLU A&B, COVID)
Influenza A by PCR: NEGATIVE
Influenza B by PCR: NEGATIVE
SARS Coronavirus 2 by RT PCR: NEGATIVE

## 2020-08-19 LAB — TYPE AND SCREEN
ABO/RH(D): AB POS
Antibody Screen: NEGATIVE

## 2020-08-19 LAB — PROTEIN / CREATININE RATIO, URINE
Creatinine, Urine: 43.05 mg/dL
Total Protein, Urine: <6 mg/dL

## 2020-08-19 LAB — URIC ACID: Uric Acid, Serum: 6.5 mg/dL (ref 2.5–7.1)

## 2020-08-19 MED ORDER — OXYTOCIN BOLUS FROM INFUSION
333.0000 mL | Freq: Once | INTRAVENOUS | Status: AC
  Administered 2020-08-20: 333 mL via INTRAVENOUS

## 2020-08-19 MED ORDER — ONDANSETRON HCL 4 MG/2ML IJ SOLN: 4.0000 mg | Freq: Four times a day (QID) | INTRAMUSCULAR | Status: DC | PRN

## 2020-08-19 MED ORDER — LACTATED RINGERS IV SOLN: 500.0000 mL | INTRAVENOUS | Status: DC | PRN

## 2020-08-19 MED ORDER — LACTATED RINGERS IV SOLN: INTRAVENOUS | Status: DC

## 2020-08-19 MED ORDER — ACETAMINOPHEN 325 MG PO TABS: 650.0000 mg | ORAL_TABLET | ORAL | Status: DC | PRN

## 2020-08-19 MED ORDER — OXYTOCIN-SODIUM CHLORIDE 30-0.9 UT/500ML-% IV SOLN
2.5000 [IU]/h | INTRAVENOUS | Status: DC
  Filled 2020-08-19: qty 500

## 2020-08-19 MED ORDER — LIDOCAINE HCL (PF) 1 % IJ SOLN
30.0000 mL | INTRAMUSCULAR | Status: AC | PRN
  Administered 2020-08-20: 30 mL via SUBCUTANEOUS
  Filled 2020-08-19: qty 30

## 2020-08-19 MED ORDER — SOD CITRATE-CITRIC ACID 500-334 MG/5ML PO SOLN: 30.0000 mL | ORAL | Status: DC | PRN

## 2020-08-19 NOTE — H&P (Signed)
OB ADMISSION/ HISTORY & PHYSICAL:  Admission Date: 08/19/2020  6:19 PM  Admit Diagnosis: Pregnancy    Jennifer Caldwell is a 29 y.o. female G2P0101 at [redacted]w[redacted]d presenting for observation for elevated blood pressure in the office with a Category 2 tracing.  She presented to the office today with a BP of 148/98 and repeat 145/80 and a 5 pound weight gain since last week.  She denies HA, visual changes, RUQ/epigastric pain. On NST in the office, several variable decelerations and 1 late deceleration was noted.  She was sent to L&D for direct admission.  However, upon arrival, she has been normotensive.  She is contracting regularly, every 2-3 minutes.  She was 3/70/-2 in the office.   Prenatal History: G2P0101   EDC : 09/04/2020  [facility] at [facility] since 11 weeks   Prenatal course complicated by: - Hx. Of PTB at [redacted]w[redacted]d with presumed partial placental abruption, declined 17-P injections - Thickened nuchal fold 5.63mm, resolved by 24 weeks; Low Risk Panorama and s/p [facility] consult with normal fetal echo - Hx. Of preterm cervical dilation at 32 weeks and s/p BMZ course and PRN Procardia, which was stopped at 37 weeks  - Varicose veins    Prenatal Labs: ABO, Rh:   AB Pos Antibody:  Negative Rubella:   Immune RPR:   NR HBsAg:   Neg HIV:   NR GBS:   Negative 1 hr Glucola : Declined, passed self screen Genetic Screening: Low Risk Panorama Ultrasound: CUS: thickened nuchal fold 5.22mm, otherwise normal female anatomy    Maternal Diabetes: No Genetic Screening: Normal Maternal Ultrasounds/Referrals: Other:Thickened nuchal fold on anatomy scan 5.67mm, but resolved by 24 weeks.  Fetal Ultrasounds or other Referrals:  Fetal echo WNL Maternal Substance Abuse:  No Significant Maternal Medications:  None Significant Maternal Lab Results:  Group B Strep negative Other Comments:  None  Medical / Surgical History :  Past medical history: No past medical history on file.    Past surgical history:  Past Surgical History:  Procedure Laterality Date  . NO PAST SURGERIES       Family History: No family history on file.   Social History:  reports that she has never smoked. She has never used smokeless tobacco. She reports that she does not drink alcohol and does not use drugs.   Allergies: Cefzil [cefprozil] and Penicillins   Current Medications at time of admission:  Medications Prior to Admission  Medication Sig Dispense Refill Last Dose  . acetaminophen (TYLENOL) 325 MG tablet Take 650 mg by mouth every 6 (six) hours as needed for moderate pain.      Marland Kitchen ibuprofen (ADVIL,MOTRIN) 600 MG tablet Take 1 tablet (600 mg total) by mouth every 6 (six) hours. 30 tablet 0   . Prenatal Vit-Fe Fumarate-FA (PRENATAL MULTIVITAMIN) TABS tablet Take 1 tablet by mouth daily at 12 noon.        Review of Systems: ROS Denies HA, visual changes, RUQ/epigastric pain, VB, LOF, abnormal d/c (+) BH ctxs (+) LE swelling   Physical Exam: Vital signs and nursing notes reviewed.     Patient Vitals for the past 24 hrs:  BP Temp Temp src Pulse Resp Height Weight  08/19/20 2055 117/79 -- -- 69 -- -- --  08/19/20 2023 129/83 -- -- 73 -- -- --  08/19/20 2013 -- 98.5 F (36.9 C) Oral -- -- 5\' 2"  (1.575 m) 74.4 kg  08/19/20 2003 120/72 -- -- 78 -- -- --  08/19/20 1850  128/66 -- -- 78 16 -- --    General: AAO x 3, NAD, coping well  Heart: RRR Lungs:CTAB Abdomen: Gravid, NT, Leopold's EFW 7#-7#8 Extremities: trace LE edema; +2 DTRs and no clonus bilaterally  Genitalia / VE:   3cm/70%/-2 in office   FHR: 125 BPM, moderate variability, +15x15 accels, rare variable decels TOCO: Ctx q2-3 minutes/mild   Labs:   Pending T&S, CBC, RPR  Recent Labs    08/19/20 1922  WBC 13.2*  HGB 13.2  HCT 40.2  PLT 234    Assessment:  29 y.o. G2P0101 at [redacted]w[redacted]d  1. Latent stage of labor 2. FHR category 2 with progression to 1 3. GBS Negative 4. Desires low  intervention/hydrotherapy/WB 5. Breastfeeding 6. Placenta disposal per patient request  Plan:  1. Admit to BS for observation  - Serial BPs - Continuous EFM - IV hydration  2. Routine L&D orders 3. Analgesia/anesthesia PRN  4. Expectant management - Pt. Expresses strong desire to not be induced unless medically indicated. Upon arrival, she was found to be normotensive with Cat 1 fetal tracing. Discussed observation for 4 hours to assess serial blood pressures, fetal monitoring, PIH labs, and contraction assessment to determine plan of care and patient agrees with plan.   Dr. Amado Nash notified of admission / plan of care   Karena Addison CNM, MSN 08/19/2020, 6:30 PM

## 2020-08-20 ENCOUNTER — Encounter (HOSPITAL_COMMUNITY): Payer: Self-pay | Admitting: Obstetrics and Gynecology

## 2020-08-20 MED ORDER — IBUPROFEN 600 MG PO TABS
600.0000 mg | ORAL_TABLET | Freq: Four times a day (QID) | ORAL | Status: DC
  Administered 2020-08-20 – 2020-08-21 (×5): 600 mg via ORAL
  Filled 2020-08-20 (×5): qty 1

## 2020-08-20 MED ORDER — TERBUTALINE SULFATE 1 MG/ML IJ SOLN: 0.2500 mg | Freq: Once | INTRAMUSCULAR | Status: DC | PRN

## 2020-08-20 MED ORDER — PRENATAL MULTIVITAMIN CH
1.0000 | ORAL_TABLET | Freq: Every day | ORAL | Status: DC
  Administered 2020-08-21: 1 via ORAL
  Filled 2020-08-20: qty 1

## 2020-08-20 MED ORDER — FLEET ENEMA 7-19 GM/118ML RE ENEM: 1.0000 | ENEMA | Freq: Every day | RECTAL | Status: DC | PRN

## 2020-08-20 MED ORDER — HYDROXYZINE HCL 25 MG PO TABS: 25.0000 mg | ORAL_TABLET | Freq: Three times a day (TID) | ORAL | Status: DC | PRN

## 2020-08-20 MED ORDER — ONDANSETRON HCL 4 MG/2ML IJ SOLN: 4.0000 mg | INTRAMUSCULAR | Status: DC | PRN

## 2020-08-20 MED ORDER — MISOPROSTOL 50MCG HALF TABLET
50.0000 ug | ORAL_TABLET | ORAL | Status: DC | PRN
  Administered 2020-08-20: 50 ug via BUCCAL

## 2020-08-20 MED ORDER — ONDANSETRON HCL 4 MG PO TABS: 4.0000 mg | ORAL_TABLET | ORAL | Status: DC | PRN

## 2020-08-20 MED ORDER — SENNOSIDES-DOCUSATE SODIUM 8.6-50 MG PO TABS
2.0000 | ORAL_TABLET | ORAL | Status: DC
  Administered 2020-08-21: 2 via ORAL

## 2020-08-20 MED ORDER — COCONUT OIL OIL
1.0000 "application " | TOPICAL_OIL | Status: DC | PRN
  Administered 2020-08-20: 1 via TOPICAL

## 2020-08-20 MED ORDER — WITCH HAZEL-GLYCERIN EX PADS: 1.0000 "application " | MEDICATED_PAD | CUTANEOUS | Status: DC | PRN

## 2020-08-20 MED ORDER — BENZOCAINE-MENTHOL 20-0.5 % EX AERO
1.0000 "application " | INHALATION_SPRAY | CUTANEOUS | Status: DC | PRN
  Administered 2020-08-20: 1 via TOPICAL
  Filled 2020-08-20: qty 56

## 2020-08-20 MED ORDER — ZOLPIDEM TARTRATE 5 MG PO TABS: 5.0000 mg | ORAL_TABLET | Freq: Every evening | ORAL | Status: DC | PRN

## 2020-08-20 MED ORDER — DIBUCAINE (PERIANAL) 1 % EX OINT: 1.0000 "application " | TOPICAL_OINTMENT | CUTANEOUS | Status: DC | PRN

## 2020-08-20 MED ORDER — SIMETHICONE 80 MG PO CHEW: 80.0000 mg | CHEWABLE_TABLET | ORAL | Status: DC | PRN

## 2020-08-20 MED ORDER — TETANUS-DIPHTH-ACELL PERTUSSIS 5-2.5-18.5 LF-MCG/0.5 IM SUSP: 0.5000 mL | Freq: Once | INTRAMUSCULAR | Status: DC

## 2020-08-20 MED ORDER — DIPHENHYDRAMINE HCL 25 MG PO CAPS: 25.0000 mg | ORAL_CAPSULE | Freq: Four times a day (QID) | ORAL | Status: DC | PRN

## 2020-08-20 MED ORDER — MISOPROSTOL 50MCG HALF TABLET
ORAL_TABLET | ORAL | Status: AC
  Filled 2020-08-20: qty 1

## 2020-08-20 MED ORDER — ACETAMINOPHEN 500 MG PO TABS
1000.0000 mg | ORAL_TABLET | Freq: Four times a day (QID) | ORAL | Status: DC
  Administered 2020-08-20 – 2020-08-21 (×4): 1000 mg via ORAL
  Filled 2020-08-20 (×3): qty 2

## 2020-08-20 MED ORDER — BISACODYL 10 MG RE SUPP: 10.0000 mg | Freq: Every day | RECTAL | Status: DC | PRN

## 2020-08-20 NOTE — Progress Notes (Signed)
Jennifer Caldwell is a 29 y.o. G2P1 at [redacted]w[redacted]d by LMP admitted for observation for elevated BP in the office and Category 2 fetal tracing.  Over the course of 4 hours, pt. Was found to be normotensive with normal PIH labs. Fetal tracing remained reassuring with an occasional variable.  BPP was 8/8, AFI 26cm c/w polyhydramnios.  We continued with further fetal monitoring after her BPP, which showed intermittent decelerations, with minimal to moderate variability and 15x15 accels. Findings and strip reviewed with Dr. Amado Nash and agreed we should proceed with IOL.   Subjective: Pt. Comfortable, but anxious. Spouse, Jennifer Caldwell at bedside and supportive.  Eagerly expecting baby boy "Jennifer Caldwell." Desires low intervention induction and requests membrane sweep first to allow rest. Declines Cytotec and Pitocin at this time.   Objective: Vitals:   08/19/20 2055 08/19/20 2143 08/19/20 2309 08/20/20 0113  BP: 117/79 123/75 122/69 130/81  Pulse: 69 70 70 89  Resp:    16  Temp:    97.9 F (36.6 C)  TempSrc:    Oral  Weight:      Height:        No intake/output data recorded. No intake/output data recorded.   FHT:  FHR: 120 bpm bpm, variability: moderate,  accelerations:  Present,  decelerations:  Present intermittent variable and early UC:   irregular, every 4-6 minutes SVE:   Dilation: 3.5 Effacement (%): 70 Station: -2 Exam by:: Carlean Jews, CNM  Membrane sweep performed per patient request  Labs:   Recent Labs    08/19/20 1922  WBC 13.2*  HGB 13.2  HCT 40.2  PLT 234    Assessment / Plan: G71P1 29 y.o. [redacted]w[redacted]d Induction of labor due to Cat 2 fetal tracing and latent labor   Labor: Progressing normally Preeclampsia:  no signs or symptoms of toxicity, labs stable, no evidence of PEC Fetal Wellbeing:  Cat 1/Cat 2 Pain Control:  Labor support without medications I/D:  n/a Anticipated MOD:  NSVD  Karena Addison, CNM, MSN 08/20/2020, 1:37 AM

## 2020-08-20 NOTE — Progress Notes (Signed)
S: Resting in bed with spouse at [facility] and supportive. Feeling ctx like mild cramping in her back.  No LOF/VB.   S/P membrane sweep and one dose of buccal cytotec.  O: Vitals:   08/20/20 0113 08/20/20 0454 08/20/20 0727 08/20/20 1118  BP: 130/81 (!) 93/54 117/68 130/76  Pulse: 89 75 78 96  Resp: 16 16    Temp: 97.9 F (36.6 C) 98.1 F (36.7 C) 98.1 F (36.7 C)   TempSrc: Oral Oral Oral   Weight:      Height:         FHT:  FHR: 140 bpm, variability: moderate,  accelerations:  Present,  decelerations:  Absent UC:   irregular, every 2-3 minutes SVE:   Dilation: 4 Effacement (%): 80 Station: -1 Exam by:: Ivonne Andrew, CNM  AROM w/ patient consent Large amount clear AF, vertex well applied  A / P: Induction of labor due to intermittent cat 2 strip, latent labor Desires low intervention birth Discussed option for awaiting physiologic labor vs active management with AROM, R/B of each option discussed.  Patient opts for AROM and continued IOL OOB for position changes, miles circuit to encourage labor progression Doula en route for support.  Fetal Wellbeing:  Category I and Category II Pain Control:  Labor support without medications  Anticipated MOD:  NSVD  Neta Mends, CNM, MSN 08/20/2020, 1:41 PM

## 2020-08-20 NOTE — Progress Notes (Signed)
Jennifer Caldwell is a 29 y.o. G2P1 at [redacted]w[redacted]d by LMP admitted for observation for elevated BP in the office and Category 2 fetal tracing.  Over the course of 4 hours, pt. Was found to be normotensive with normal PIH labs. Fetal tracing remained reassuring with an occasional variable.  BPP was 8/8, AFI 26cm c/w polyhydramnios.  We continued with further fetal monitoring after her BPP, which showed intermittent decelerations, with minimal to moderate variability and 15x15 accels and pt agreed to membrane sweep for IOL.   Subjective: Slept some overnight and feeling better. Spouse, [name] at bedside and supportive.   Objective: Vitals:   08/19/20 2143 08/19/20 2309 08/20/20 0113 08/20/20 0454  BP: 123/75 122/69 130/81 (!) 93/54  Pulse: 70 70 89 75  Resp:   16 16  Temp:   97.9 F (36.6 C) 98.1 F (36.7 C)  TempSrc:   Oral Oral  Weight:      Height:        No intake/output data recorded. No intake/output data recorded.   FHT:  FHR: 125-130 bpm, variability: minimal/moderate,  accelerations:  Present,  decelerations:  Present intermittent variable decelerations UC:   irregular SVE:   Dilation: 3.5 Effacement (%): 70 Station: -2 Exam by:: Carlean Jews, CNM Membrane sweep performed per patient request  Labs:   Recent Labs    08/19/20 1922  WBC 13.2*  HGB 13.2  HCT 40.2  PLT 234    Assessment / Plan: G2P1 29 y.o. [redacted]w[redacted]d Induction of labor due to Category 2 tracing with variable decelerations  Labor: Protracted latent phase after last membrane sweep. Plan for buccal Cytotec q4hrs PRN Okay to eat breakfast and shower.  Preeclampsia:  no signs or symptoms of toxicity Fetal Wellbeing:  Category II Pain Control:  Labor support without medications I/D:  n/a Anticipated MOD:  NSVD  Karena Addison, CNM, MSN 08/20/2020, 6:57 AM

## 2020-08-20 NOTE — Progress Notes (Signed)
Jennifer Caldwell is a 29 y.o. G2P1 at [redacted]w[redacted]d by LMPadmitted for initially for observation for elevated BP in the office and Category 2 fetal tracing with variable decelerations. Over the course of 4 hours, pt. Was found to be normotensive with normal PIH labs. Fetal tracing remained reassuring with an occasional variable. BPP was 8/8, AFI 26cm c/w polyhydramnios. We continued with further fetal monitoring after her BPP, which showed intermittent decelerations, with minimal to moderate variability and 10x10 and 15x15 accels and decision was made to proceed with IOL. Pt agreed to membrane sweep overnight x 2. She is now s/p buccal Cytotec x 1 dose at 6:57am.   S: Doing well, has eaten lunch and showered. Reports contractions are starting to feel more intense in her low back. Has been using birth ball and walking for comfort measures. Spouse, Asher Muir, at bedside for support and planning doula support in active labor.    O: Vitals:   08/19/20 2309 08/20/20 0113 08/20/20 0454 08/20/20 0727  BP: 122/69 130/81 (!) 93/54 117/68  Pulse: 70 89 75 78  Resp:  16 16   Temp:  97.9 F (36.6 C) 98.1 F (36.7 C) 98.1 F (36.7 C)  TempSrc:  Oral Oral Oral  Weight:      Height:         FHT:  FHR: 135 bpm, variability: moderate,  accelerations:  Present,  decelerations:  Present occasional variable and late  UC:   regular, every 1-4 minutes/ mild SVE: Deferred   A / P: Induction of labor due to Category 2 fetal tracing with intermittent variable decelerations  Fetal Wellbeing:  Category II Pain Control:  Labor support without medications No evidence of GHTN or PEC Anticipated MOD:  NSVD   Arlan Organ, CNM to assume care and will reassess for next intervention.   Karena Addison, CNM, MSN 08/20/2020, 11:54 AM

## 2020-08-21 LAB — CBC
HCT: 33.4 % — ABNORMAL LOW (ref 36.0–46.0)
Hemoglobin: 11.1 g/dL — ABNORMAL LOW (ref 12.0–15.0)
MCH: 29.8 pg (ref 26.0–34.0)
MCHC: 33.2 g/dL (ref 30.0–36.0)
MCV: 89.8 fL (ref 80.0–100.0)
Platelets: 190 10*3/uL (ref 150–400)
RBC: 3.72 MIL/uL — ABNORMAL LOW (ref 3.87–5.11)
RDW: 12.5 % (ref 11.5–15.5)
WBC: 16.1 10*3/uL — ABNORMAL HIGH (ref 4.0–10.5)
nRBC: 0 % (ref 0.0–0.2)

## 2020-08-21 MED ORDER — ACETAMINOPHEN 500 MG PO TABS: 1000.0000 mg | ORAL_TABLET | Freq: Four times a day (QID) | ORAL | 0 refills | Status: DC

## 2020-08-21 MED ORDER — IBUPROFEN 600 MG PO TABS: 600.0000 mg | ORAL_TABLET | Freq: Four times a day (QID) | ORAL | 0 refills | Status: DC

## 2020-08-21 MED ORDER — COCONUT OIL OIL: 1.0000 "application " | TOPICAL_OIL | 0 refills | Status: DC | PRN

## 2020-08-21 MED ORDER — BENZOCAINE-MENTHOL 20-0.5 % EX AERO: 1.0000 "application " | INHALATION_SPRAY | CUTANEOUS | Status: DC | PRN

## 2020-08-21 NOTE — Lactation Note (Signed)
This note was copied from a baby's chart. Lactation Consultation Note  Patient Name: Jennifer Caldwell JJHER'D Date: 08/21/2020  Baby Jennifer [name] now 22 hours.  Mom reports they have been breastfeeding well except starting to get a little sore.  Mom has small blisters on the tips of each nipple.  Infant cuing a little on arrival.  Mom reports he just ate but might eat again.  Mom reports sometimes her nipples come out a little misshapen.  Assisted with latch to try and get him in closer.  Got him in closer and mom reported still a little uncomfortable.  Urged mom to do a gentle chin tug and try to him in closer.  Discussed holding breast. Mom reports a little more comfort adding that. Infant came off and nipple still slightly compressed.  Showed mom Tresa Endo mom how to latch.  Mom reports she understands.  Urged hand express and rub expressed mothers milk on nipple/air dry and then use something else if needed.  Vary breastfeeding positions to help sore nipples.  Showed mom how to hand express and spoon feed. Urged mom to get dad to add some after breastfeedings while they are learning.  Urged mom to offer the breast 8-12 or more times day.  Urged parents to call lactation as needed.  Urged mom to keep checking nipples past breastfeeding.   Maternal Data    Feeding Feeding Type: Breast Fed  LATCH Score                   Interventions    Lactation Tools Discussed/Used     Consult Status      Jasmain Ahlberg Michaelle Copas 08/21/2020, 5:50 PM

## 2020-08-21 NOTE — Progress Notes (Addendum)
PPD #1, SVD, 2nd degree perineal, baby boy "[name]"  S:  Reports feeling great, very happy with experience  Desires d/c home at 24 hours              Tolerating po/ No nausea or vomiting / Denies dizziness or SOB             Bleeding is getting lighter              Pain controlled with Motrin             Up ad lib / ambulatory / voiding QS  Newborn breast feeding - going well  / Circumcision - declines   O:               VS: BP 107/68 (BP Location: Left Arm)   Pulse 66   Temp 98.4 F (36.9 C) (Oral)   Resp 16   Ht 5\' 2"  (1.575 m)   Wt 74.4 kg   SpO2 99%   Breastfeeding Unknown   BMI 30.00 kg/m   Patient Vitals for the past 24 hrs:  BP Temp Temp src Pulse Resp SpO2  08/21/20 0527 107/68 98.4 F (36.9 C) Oral 66 16 99 %  08/21/20 0051 106/63 97.8 F (36.6 C) Oral 78 16 98 %  08/20/20 2010 115/71 98.3 F (36.8 C) Oral 68 16 99 %  08/20/20 1858 113/65 98.3 F (36.8 C) Oral 69 18 --  08/20/20 1816 116/62 -- -- 70 -- --  08/20/20 1801 110/72 -- -- 66 -- --  08/20/20 1746 (!) 93/57 -- -- 76 -- --  08/20/20 1730 120/65 -- -- 81 -- --  08/20/20 1728 (!) 117/53 -- -- 76 -- --  08/20/20 1725 (!) 122/57 -- -- 83 -- --  08/20/20 1500 -- 98 F (36.7 C) Oral -- -- --  08/20/20 1331 122/73 97.8 F (36.6 C) Oral 82 18 --     LABS:             Recent Labs    08/19/20 1922 08/21/20 0541  WBC 13.2* 16.1*  HGB 13.2 11.1*  PLT 234 190               Blood type: --/--/AB POS (10/13 1915)  Rubella: Immune (04/12 0000)                     I&O: Intake/Output      10/14 0701 - 10/15 0700 10/15 0701 - 10/16 0700   Blood 167    Total Output 167    Net -167                       Physical Exam:             Alert and oriented X3  Lungs: Clear and unlabored  Heart: regular rate and rhythm / no murmurs  Abdomen: soft, non-tender, non-distended              Fundus: firm, non-tender, U-2 slightly displaced to right with bladder distention   Perineum: well approximated 2nd degree,  mild edema   Lochia: appropriate  Extremities: trace pedal edema, no calf pain or tenderness    A:/P PPD # 1, SVD  Mild ABL Anemia    - stable, asymptomatic   Transient elevated BP in office without dx of hypertension   - no s/s of HTN or PEC   - Plan to check with home BP cuff and  call office with results in 1 week   2nd degree perineal laceration    Doing well - stable status  Routine post partum orders  Lactation support PRN  WOB book given, discharge instructions and warning s/s reviewed  Discharge home at 24 hours if baby okay to be discharged  Carlean Jews, MSN, CNM [facility]

## 2020-08-21 NOTE — Discharge Summary (Signed)
Postpartum Discharge Summary  Date of Service updated 08/21/2020     Patient Name: Jennifer Caldwell DOB: 11-17-1990 MRN: [id number]  Date of admission: 08/19/2020 Delivery date:08/20/2020  Delivering provider: Juliene Pina  Date of discharge: 08/21/2020  Admitting diagnosis: variable fetal heart rate decelerations Intrauterine pregnancy: [redacted]w[redacted]d    Secondary diagnosis:  Principal Problem:   Postpartum care following vaginal delivery 10/14 Active Problems:   SVD   Second-degree perineal laceration, with delivery   Variable fetal heart rate decelerations, antepartum  Additional problems:     Discharge diagnosis: Term Pregnancy Delivered                                              Post partum procedures:n/a Augmentation: AROM and Cytotec Complications: None  Hospital course: Induction of Labor With Vaginal Delivery   29y.o. yo G2P1001 at 33w6das admitted to the hospital 08/19/2020 for induction of labor.  Indication for induction: variable decelerations; inital elevated BP in office without diagnosis of hypertension.  Patient had an uncomplicated labor course as follows: Membrane Rupture Time/Date: 1:23 PM ,08/20/2020   Delivery Method:Vaginal, Spontaneous  Episiotomy: None  Lacerations:  2nd degree  Details of delivery can be found in separate delivery note.  Patient had a routine postpartum course. Patient is discharged home 08/21/20.  Newborn Data: Birth date:08/20/2020  Birth time:5:19 PM  Gender:Female  Living status:Living  Apgars:8 ,9  Weight:3690 g   "[name] Magnesium Sulfate received: No BMZ received: Yes at 32 weeks Rhophylac:No MMR:N/A T-DaP:declined Flu: No Transfusion:No  Physical exam  Vitals:   08/20/20 1858 08/20/20 2010 08/21/20 0051 08/21/20 0527  BP: 113/65 115/71 106/63 107/68  Pulse: 69 68 78 66  Resp: '18 16 16 16  ' Temp: 98.3 F (36.8 C) 98.3 F (36.8 C) 97.8 F (36.6 C) 98.4 F (36.9 C)  TempSrc: Oral Oral Oral Oral   SpO2:  99% 98% 99%  Weight:      Height:      Alert and oriented X3             Lungs: Clear and unlabored             Heart: regular rate and rhythm / no murmurs             Abdomen: soft, non-tender, non-distended              Fundus: firm, non-tender, U-2 slightly displaced to right with bladder distention              Perineum: well approximated 2nd degree, mild edema              Lochia: appropriate             Extremities: trace pedal edema, no calf pain or tenderness                Labs: Lab Results  Component Value Date   WBC 16.1 (H) 08/21/2020   HGB 11.1 (L) 08/21/2020   HCT 33.4 (L) 08/21/2020   MCV 89.8 08/21/2020   PLT 190 08/21/2020   CMP Latest Ref Rng & Units 08/19/2020  Glucose 70 - 99 mg/dL 73  BUN 6 - 20 mg/dL 6  Creatinine 0.44 - 1.00 mg/dL 0.66  Sodium 135 - 145 mmol/L 135  Potassium 3.5 - 5.1 mmol/L 3.4(L)  Chloride 98 -  111 mmol/L 104  CO2 22 - 32 mmol/L 18(L)  Calcium 8.9 - 10.3 mg/dL 9.2  Total Protein 6.5 - 8.1 g/dL 6.2(L)  Total Bilirubin 0.3 - 1.2 mg/dL 1.5(H)  Alkaline Phos 38 - 126 U/L 155(H)  AST 15 - 41 U/L 18  ALT 0 - 44 U/L 13   Edinburgh Score: Edinburgh Postnatal Depression Scale Screening Tool 09/27/2018  I have been able to laugh and see the funny side of things. 0  I have looked forward with enjoyment to things. 0  I have blamed myself unnecessarily when things went wrong. 0  I have been anxious or worried for no good reason. 1  I have felt scared or panicky for no good reason. 0  Things have been getting on top of me. 0  I have been so unhappy that I have had difficulty sleeping. 0  I have felt sad or miserable. 0  I have been so unhappy that I have been crying. 0  The thought of harming myself has occurred to me. 0  Edinburgh Postnatal Depression Scale Total 1      After visit meds:  Allergies as of 08/21/2020      Reactions   Cefzil [cefprozil] Hives   Penicillins Hives   Childhood reaction-not sure       Medication List    TAKE these medications   acetaminophen 500 MG tablet Commonly known as: TYLENOL Take 2 tablets (1,000 mg total) by mouth every 6 (six) hours. What changed:   medication strength  how much to take  when to take this  reasons to take this   benzocaine-Menthol 20-0.5 % Aero Commonly known as: DERMOPLAST Apply 1 application topically as needed for irritation (perineal discomfort).   coconut oil Oil Apply 1 application topically as needed.   ibuprofen 600 MG tablet Commonly known as: ADVIL Take 1 tablet (600 mg total) by mouth every 6 (six) hours.   prenatal multivitamin Tabs tablet Take 1 tablet by mouth daily at 12 noon.            Discharge Care Instructions  (From admission, onward)         Start     Ordered   08/21/20 0000  Discharge wound care:       Comments: Warm water sitz baths as needed   08/21/20 1218           Discharge home in stable condition Infant Feeding: Unsure Infant Disposition: pending d/c at 24 hours Discharge instruction: per After Visit Summary and Postpartum booklet. Activity: Advance as tolerated. Pelvic rest for 6 weeks.  Diet: low salt diet Anticipated Birth Control: Unsure Postpartum Appointment:6 weeks Additional Postpartum F/U: Postpartum Depression checkup, call BP check in 1 week Future Appointments:No future appointments. Follow up Visit:  Follow-up Information    Juliene Pina, CNM. Schedule an appointment as soon as possible for a visit in 6 week(s).   Specialty: Obstetrics and Gynecology Why: Postpartum visit; call office in 1 week with home BP reading Contact information: [address] [place] [address] [phone]                   08/21/2020 Jennifer Caldwell, CNM

## 2020-08-21 NOTE — Discharge Instructions (Signed)
Sitz baths 2 times /day with warm water x 1 week May add herbals: 1 ounce dried comfrey leaf* 1 ounce calendula flowers 1 ounce lavender flowers 1/2 ounce dried uva ursi leaves 1/2 ounce witch hazel blossoms (if you can find them) 1/2 ounce dried sage leaf 1/2 cup sea salt Directions: Bring 2 quarts of water to a boil. Turn off heat, and place 1 ounce (approximately 1 large handful) of the above mixed herbs (not the salt) into the pot. Steep, covered, for 30 minutes.  Strain the liquid well with a fine mesh strainer, and discard the herb material. Add 2 quarts of liquid to the tub, along with the 1/2 cup of salt. This medicinal liquid can also be made into compresses and peri-rinses. 

## 2020-08-22 ENCOUNTER — Ambulatory Visit: Payer: Self-pay

## 2020-08-22 LAB — RPR: RPR Ser Ql: NONREACTIVE

## 2020-08-22 NOTE — Lactation Note (Signed)
This note was copied from a baby's chart. Lactation Consultation Note  Patient Name: Jennifer Caldwell [id number] Date: 08/22/2020 Reason for consult: Follow-up assessment;Early term 37-38.6wks;Infant weight loss;Hyperbilirubinemia;Other (Comment) (off the double photo/ for D/C) Baby is 45 hours old.  As LC entered the room baby latched on the right breast and LC offered to check the latch and mom receptive.  LC noted the upper lip to be slightly rolled under and LC flipped to increase flanged and eased down the chin. Increased swallows noted.  Per mom having some pinching to start. LC checked the left breast and noted areola edema and provided the shells between feedings except when sleeping and comfort gels if needed.  Sore nipple and engorgement prevention and tx reviewed.  Per mom had been given a hand pump and has a DEBP at home.  Per mom will be seeing Senaida Ores after D/C.  LC provided the [facility] pamphlet with phone numbers.   Maternal Data    Feeding Feeding Type:  (baby latched with swallows)  LATCH Score Latch:  (latched with depth)  Audible Swallowing:  (increased swallows with compressions)     Comfort (Breast/Nipple):  (breast are filling and warm)  Hold (Positioning):  (mom independent with latch)     Interventions Interventions: Breast feeding basics reviewed  Lactation Tools Discussed/Used Tools: Shells;Pump;Comfort gels Shell Type: Inverted Breast pump type: Manual Pump Review: Milk Storage   Consult Status Consult Status: Complete Date: 08/22/20    Kathrin Greathouse 08/22/2020, 3:10 PM

## 2020-09-04 ENCOUNTER — Inpatient Hospital Stay (HOSPITAL_COMMUNITY): Admit: 2020-09-04 | Payer: Self-pay

## 2021-09-10 LAB — OB RESULTS CONSOLE ANTIBODY SCREEN: Antibody Screen: NEGATIVE

## 2021-09-10 LAB — OB RESULTS CONSOLE ABO/RH: RH Type: POSITIVE

## 2021-10-18 LAB — OB RESULTS CONSOLE RPR: RPR: NONREACTIVE

## 2021-10-18 LAB — OB RESULTS CONSOLE HEPATITIS B SURFACE ANTIGEN: Hepatitis B Surface Ag: NEGATIVE

## 2021-10-18 LAB — OB RESULTS CONSOLE HIV ANTIBODY (ROUTINE TESTING): HIV: NONREACTIVE

## 2021-10-18 LAB — OB RESULTS CONSOLE GC/CHLAMYDIA
Chlamydia: NEGATIVE
Neisseria Gonorrhea: NEGATIVE

## 2021-10-18 LAB — OB RESULTS CONSOLE RUBELLA ANTIBODY, IGM: Rubella: IMMUNE

## 2021-11-15 IMAGING — US US MFM FETAL BPP W/O NON-STRESS
1 series · 12 of 28 positions shown · non-contrast
Comparison: none

[Series 1: us mfm fetal bpp w/o non-stress · 28 acquisitions, 12 frames shown]
[im 2/28]
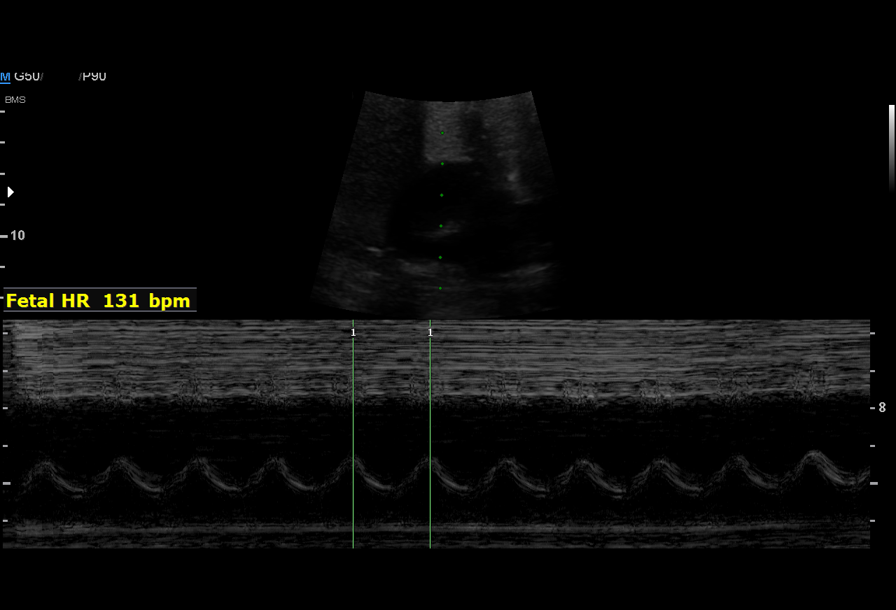
[im 4/28]
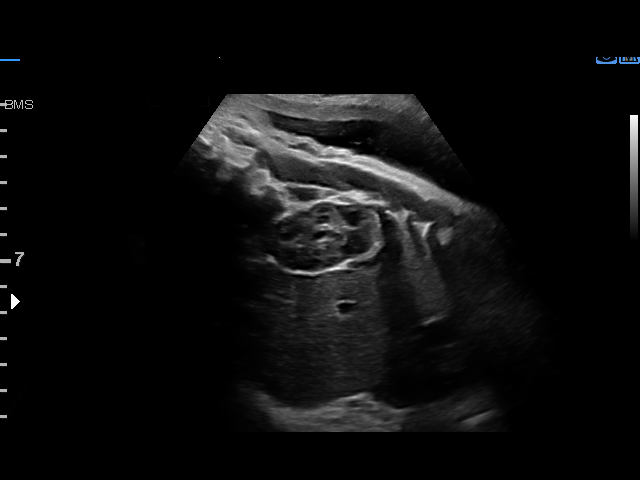
[im 6/28]
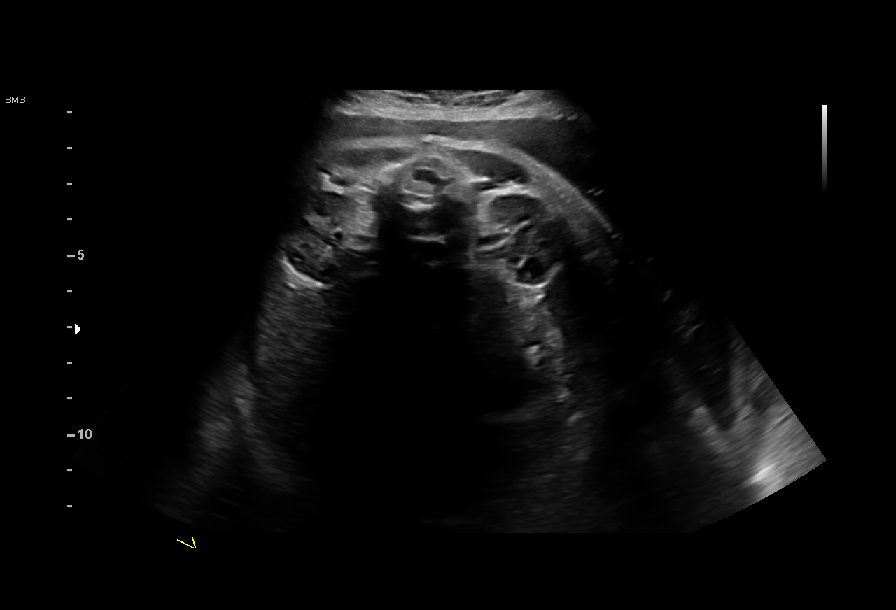
[im 9/28]
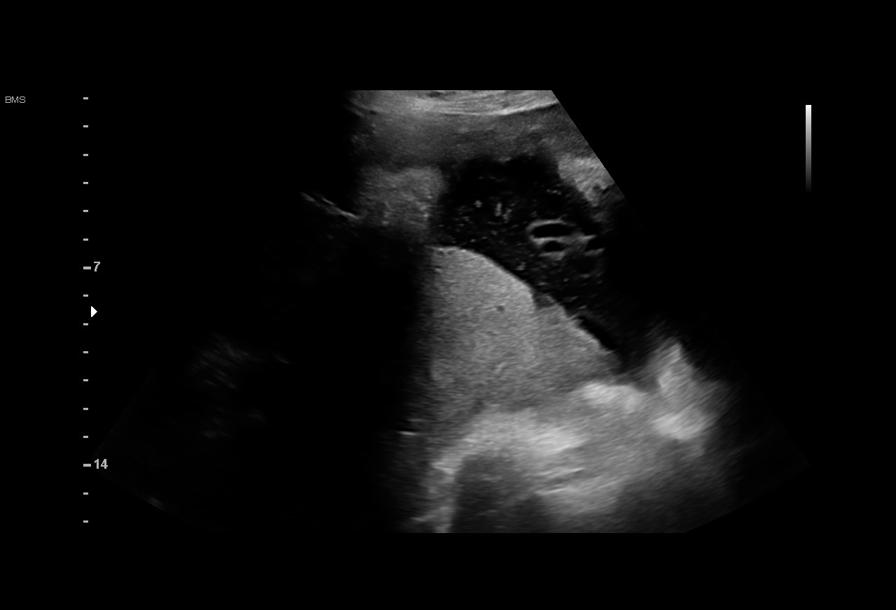
[im 11/28]
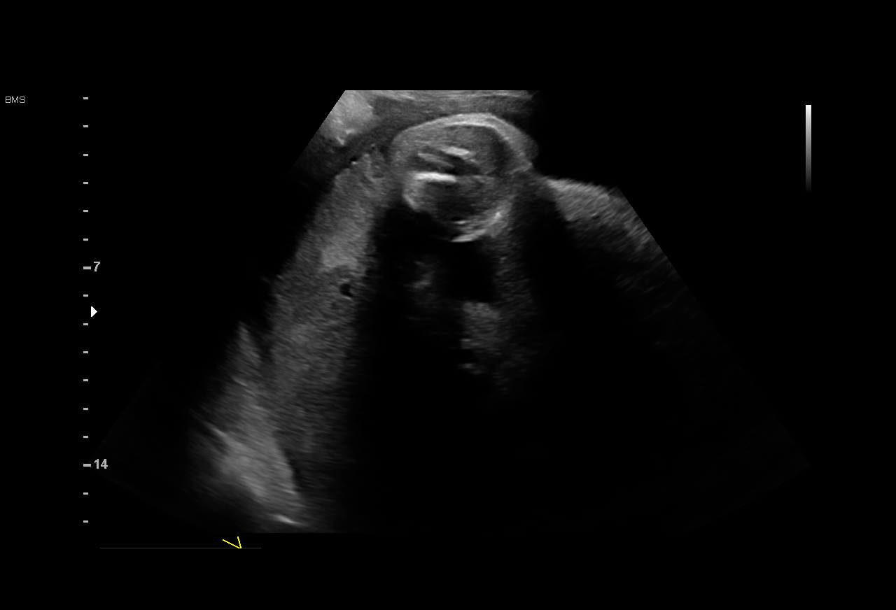
[im 13/28]
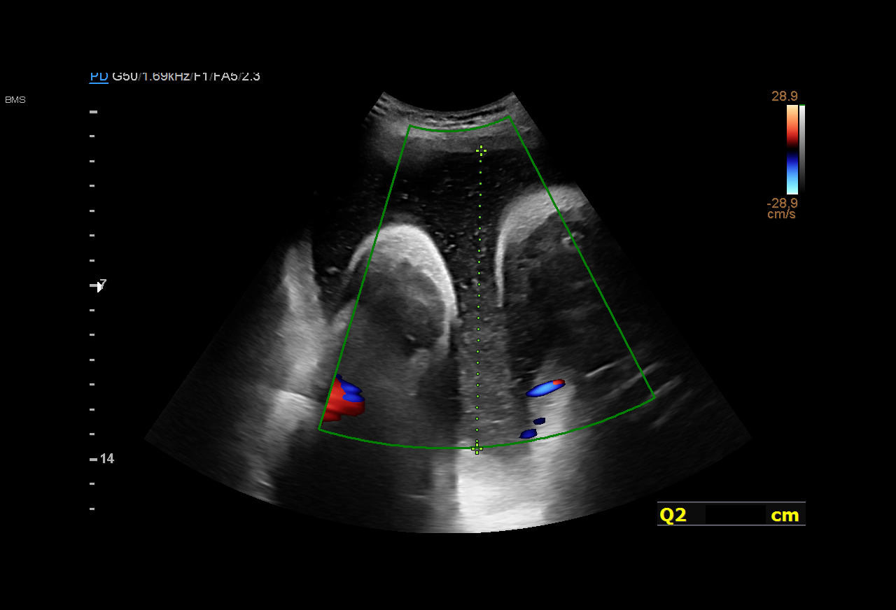
[im 16/28]
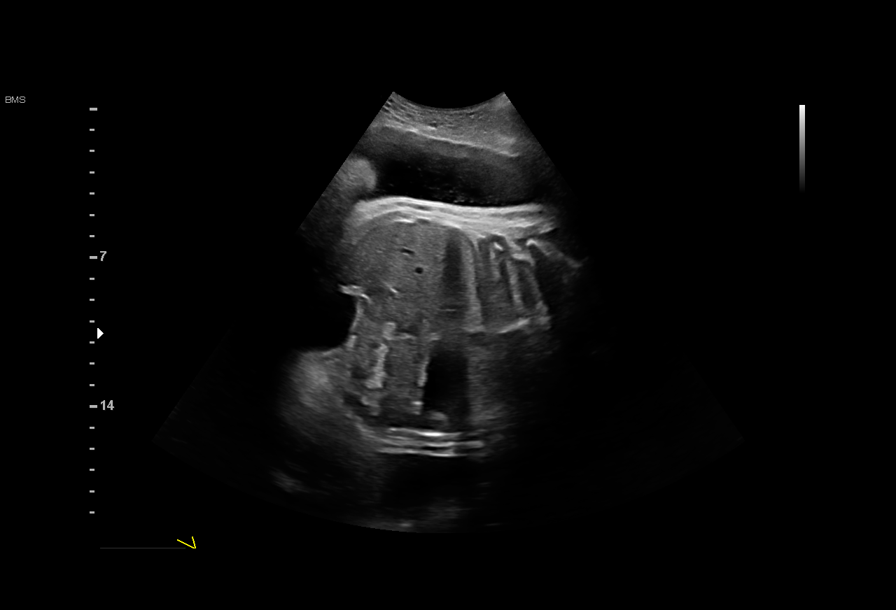
[im 18/28]
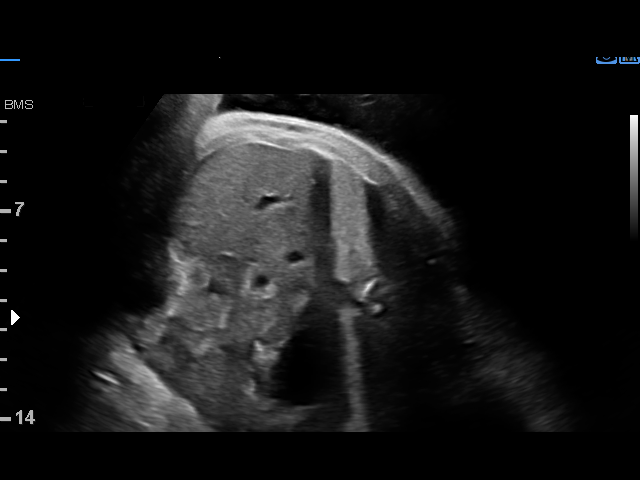
[im 20/28]
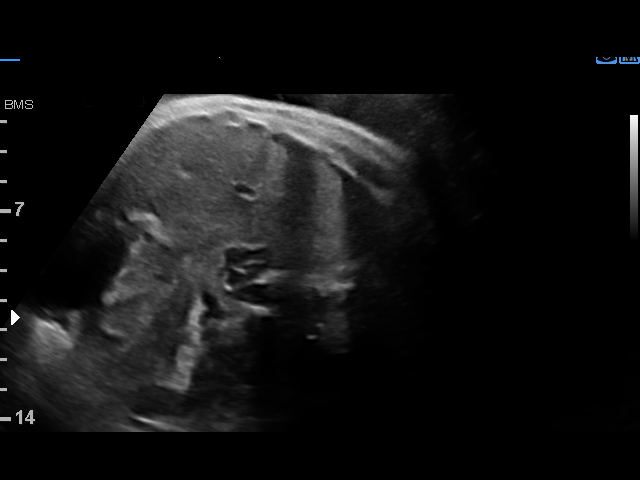
[im 23/28]
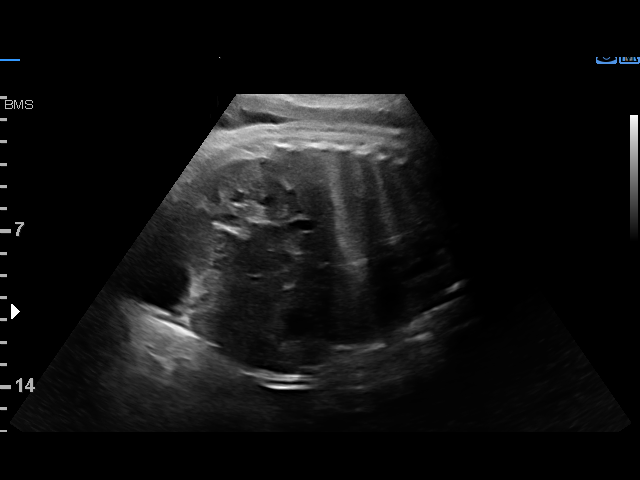
[im 25/28]
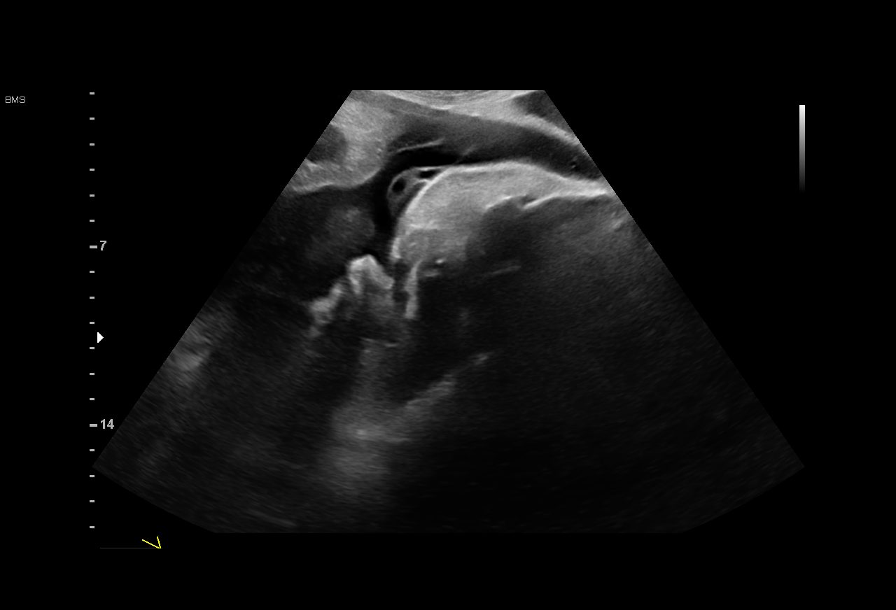
[im 27/28]
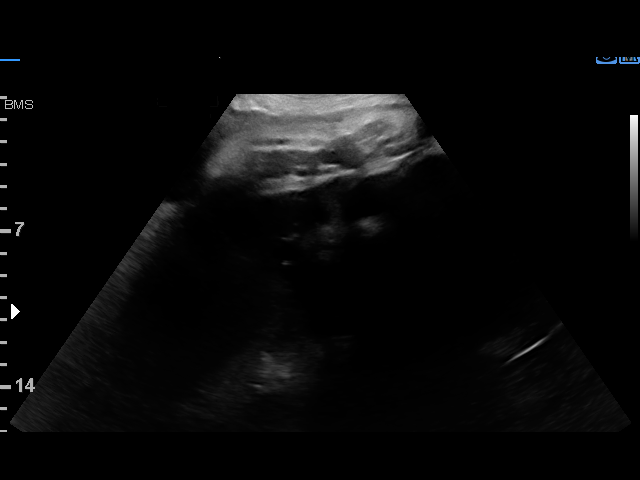

[12 of 28 positions shown; findings below may reference images not displayed]

Suites
                   TIMS

Indications

 Non-reactive NST, FHR decelerations
 37 weeks gestation of pregnancy
 Poor obstetric history: Previous preterm
 delivery, antepartum (abruption)
 Hypertension - Gestational
Fetal Evaluation

 Num Of Fetuses:         1
 Fetal Heart Rate(bpm):  131
 Cardiac Activity:       Observed
 Presentation:           Cephalic
 Placenta:               Posterior

 Amniotic Fluid
 AFI FV:      Polyhydramnios

 AFI Sum(cm)     %Tile       Largest Pocket(cm)
 26.[REDACTED]

 RUQ(cm)       RLQ(cm)       LUQ(cm)        LLQ(cm)

Biophysical Evaluation

 Amniotic F.V:   Polyhydramnios             F. Tone:        Observed
 F. Movement:    Observed                   Score:          [DATE]
 F. Breathing:   Observed
OB History
 Gravidity:    2         Term:   0        Prem:   1        SAB:   0
 TOP:          0       Ectopic:  0        Living: 1
Gestational Age

 Best:          37w 5d     Det. By:  Early Ultrasound         EDD:   09/04/20
Anatomy

 Stomach:               Appears normal, left   Bladder:                Appears normal
                        sided
Comments

 This patient had a biophysical profile performed due to fetal
 heart rate decelerations noted on her NST.
 A biophysical profile performed today was [DATE].
 Polyhydramnios with a total AFI of 26.97 cm is noted today.
 The patient was admitted for induction of labor due to
 recurrent variable decelerations.

## 2022-04-05 LAB — OB RESULTS CONSOLE GBS: GBS: NEGATIVE

## 2022-04-29 ENCOUNTER — Inpatient Hospital Stay (HOSPITAL_COMMUNITY)
Admission: AD | Admit: 2022-04-29 | Discharge: 2022-04-29 | Disposition: A | Discharge status: Home / Self Care | Payer: BC Managed Care – PPO | Source: Home / Self Care | Attending: Obstetrics and Gynecology | Admitting: Obstetrics and Gynecology

## 2022-04-29 ENCOUNTER — Encounter (HOSPITAL_COMMUNITY): Payer: Self-pay | Admitting: Obstetrics and Gynecology

## 2022-04-29 ENCOUNTER — Other Ambulatory Visit: Payer: Self-pay

## 2022-04-29 DIAGNOSIS — O471 False labor at or after 37 completed weeks of gestation: Secondary | ICD-10-CM | POA: Insufficient documentation

## 2022-04-29 DIAGNOSIS — Z3A39 39 weeks gestation of pregnancy: Secondary | ICD-10-CM | POA: Insufficient documentation

## 2022-04-29 NOTE — MAU Provider Note (Signed)
  History   Jennifer Caldwell is a 31 y.o. G3P1001 at [redacted]w[redacted]d presenting for labor check.  Reports ctx started this morning about 7 am and have become more regular this afternoon. Reports good FM, no LOF, no VB.  Coping well with labor, and has been planning a natural waterbirth.  Spouse Jennifer Caldwell present and supportive.   Prenatal course uncomplicated.  Hx G1 34 wks PTB/? Abruption; G2 uncomplicated natural birth  PN labs wnl, declined genetics, GBS neg.    CSN: [id number]  Arrival date and time: 04/29/22 1820   Event Date/Time   First Provider Initiated Contact with Patient 04/29/22 1908      Chief Complaint  Patient presents with   Contractions   HPI  OB History     Gravida  3   Para  2   Term  1   Preterm      AB      Living  1      SAB      IAB      Ectopic      Multiple  0   Live Births  1           History reviewed. No pertinent past medical history.  Past Surgical History:  Procedure Laterality Date   NO PAST SURGERIES      No family history on file.  Social History   Tobacco Use   Smoking status: Never   Smokeless tobacco: Never  Vaping Use   Vaping Use: Never used  Substance Use Topics   Alcohol use: Never   Drug use: Never    Allergies:  Allergies  Allergen Reactions   Cefzil [Cefprozil] Hives   Penicillins Hives    Childhood reaction-not sure    Meds: PNV  Review of Systems As noted above Physical Exam   Blood pressure 134/75, pulse 83, temperature 98.3 F (36.8 C), temperature source Oral, resp. rate 17, height 5\' 2"  (1.575 m), weight 75.5 kg, SpO2 96 %, unknown if currently breastfeeding.  Physical Exam AAO x 3, NAD, coping well Abd: gravid, NT, S=D GU: 3-4/60/-2, IBOW, membrane sweep with patient permission Vertex  MAU Course  Procedures  MDM Labor check, EFM, membrane sweep  Assessment and Plan  G3P1001 at [redacted]w[redacted]d  Latency FHT category 1  Desires low intervention, expectant management, declines AROM  IOL tonight.  Discharge home with labor precautions.     Neta Mends, CNM 04/29/2022, 7:25 PM

## 2022-04-29 NOTE — MAU Note (Signed)
Jennifer Caldwell is a 31 y.o. at Unknown here in MAU reporting: contractions started this morning, now every 3-82min.  no bleeding or leaking.  +FM. No problems with preg.  -GBS. Has not been checked.  Onset of complaint: this morning Pain score: 5-6 Vitals:   04/29/22 1835  BP: 132/76  Pulse: 86  Resp: 18  Temp: 98.3 F (36.8 C)  SpO2: 100%     [id number] on Lab orders placed from triage:  none

## 2022-04-30 ENCOUNTER — Inpatient Hospital Stay (HOSPITAL_COMMUNITY)
Admission: AD | Admit: 2022-04-30 | Discharge: 2022-05-01 | DRG: 807 | Disposition: A | Discharge status: Home / Self Care | Payer: BC Managed Care – PPO | Attending: Obstetrics and Gynecology | Admitting: Obstetrics and Gynecology

## 2022-04-30 ENCOUNTER — Encounter (HOSPITAL_COMMUNITY): Payer: Self-pay | Admitting: Obstetrics and Gynecology

## 2022-04-30 DIAGNOSIS — Z3A39 39 weeks gestation of pregnancy: Secondary | ICD-10-CM

## 2022-04-30 DIAGNOSIS — O874 Varicose veins of lower extremity in the puerperium: Secondary | ICD-10-CM | POA: Diagnosis present

## 2022-04-30 DIAGNOSIS — O26893 Other specified pregnancy related conditions, third trimester: Secondary | ICD-10-CM | POA: Diagnosis present

## 2022-04-30 DIAGNOSIS — O4593 Premature separation of placenta, unspecified, third trimester: Secondary | ICD-10-CM | POA: Diagnosis present

## 2022-04-30 LAB — TYPE AND SCREEN
ABO/RH(D): AB POS
Antibody Screen: NEGATIVE

## 2022-04-30 LAB — CBC
HCT: 36 % (ref 36.0–46.0)
Hemoglobin: 12.2 g/dL (ref 12.0–15.0)
MCH: 29.4 pg (ref 26.0–34.0)
MCHC: 33.9 g/dL (ref 30.0–36.0)
MCV: 86.7 fL (ref 80.0–100.0)
Platelets: 257 10*3/uL (ref 150–400)
RBC: 4.15 MIL/uL (ref 3.87–5.11)
RDW: 12.9 % (ref 11.5–15.5)
WBC: 19.1 10*3/uL — ABNORMAL HIGH (ref 4.0–10.5)
nRBC: 0 % (ref 0.0–0.2)

## 2022-04-30 LAB — GLUCOSE, CAPILLARY: Glucose-Capillary: 107 mg/dL — ABNORMAL HIGH (ref 70–99)

## 2022-04-30 LAB — RPR: RPR Ser Ql: NONREACTIVE

## 2022-04-30 MED ORDER — LIDOCAINE HCL (PF) 1 % IJ SOLN
30.0000 mL | INTRAMUSCULAR | Status: DC | PRN
  Filled 2022-04-30: qty 30

## 2022-04-30 MED ORDER — DIBUCAINE (PERIANAL) 1 % EX OINT
1.0000 | TOPICAL_OINTMENT | CUTANEOUS | Status: DC | PRN
Start: 2022-04-30 — End: 2022-05-01

## 2022-04-30 MED ORDER — ONDANSETRON HCL 4 MG/2ML IJ SOLN: 4.0000 mg | Freq: Four times a day (QID) | INTRAMUSCULAR | Status: DC | PRN

## 2022-04-30 MED ORDER — ZOLPIDEM TARTRATE 5 MG PO TABS: 5.0000 mg | ORAL_TABLET | Freq: Every evening | ORAL | Status: DC | PRN

## 2022-04-30 MED ORDER — WITCH HAZEL-GLYCERIN EX PADS: 1.0000 | MEDICATED_PAD | CUTANEOUS | Status: DC | PRN

## 2022-04-30 MED ORDER — DIPHENHYDRAMINE HCL 25 MG PO CAPS: 25.0000 mg | ORAL_CAPSULE | Freq: Four times a day (QID) | ORAL | Status: DC | PRN

## 2022-04-30 MED ORDER — OXYTOCIN-SODIUM CHLORIDE 30-0.9 UT/500ML-% IV SOLN
2.5000 [IU]/h | INTRAVENOUS | Status: DC
  Filled 2022-04-30: qty 500

## 2022-04-30 MED ORDER — OXYTOCIN-SODIUM CHLORIDE 30-0.9 UT/500ML-% IV SOLN
1.0000 m[IU]/min | INTRAVENOUS | Status: DC
  Administered 2022-04-30: 4 m[IU]/min via INTRAVENOUS

## 2022-04-30 MED ORDER — LACTATED RINGERS IV SOLN: INTRAVENOUS | Status: DC

## 2022-04-30 MED ORDER — FLEET ENEMA 7-19 GM/118ML RE ENEM
1.0000 | ENEMA | Freq: Every day | RECTAL | Status: DC | PRN
Start: 2022-04-30 — End: 2022-05-01

## 2022-04-30 MED ORDER — ONDANSETRON HCL 4 MG PO TABS: 4.0000 mg | ORAL_TABLET | ORAL | Status: DC | PRN

## 2022-04-30 MED ORDER — ONDANSETRON HCL 4 MG/2ML IJ SOLN: 4.0000 mg | INTRAMUSCULAR | Status: DC | PRN

## 2022-04-30 MED ORDER — SOD CITRATE-CITRIC ACID 500-334 MG/5ML PO SOLN: 30.0000 mL | ORAL | Status: DC | PRN

## 2022-04-30 MED ORDER — OXYTOCIN 10 UNIT/ML IJ SOLN: 10.0000 [IU] | Freq: Once | INTRAMUSCULAR | Status: DC

## 2022-04-30 MED ORDER — COCONUT OIL OIL
1.0000 | TOPICAL_OIL | Status: DC | PRN
Start: 2022-04-30 — End: 2022-05-01

## 2022-04-30 MED ORDER — LACTATED RINGERS IV SOLN
500.0000 mL | INTRAVENOUS | Status: DC | PRN
  Administered 2022-04-30: 1000 mL via INTRAVENOUS

## 2022-04-30 MED ORDER — OXYTOCIN BOLUS FROM INFUSION: 333.0000 mL | Freq: Once | INTRAVENOUS | Status: DC

## 2022-04-30 MED ORDER — BENZOCAINE-MENTHOL 20-0.5 % EX AERO
1.0000 | INHALATION_SPRAY | CUTANEOUS | Status: DC | PRN
  Administered 2022-04-30: 1 via TOPICAL
  Filled 2022-04-30: qty 56

## 2022-04-30 MED ORDER — SENNOSIDES-DOCUSATE SODIUM 8.6-50 MG PO TABS
2.0000 | ORAL_TABLET | ORAL | Status: DC
  Administered 2022-05-01: 2 via ORAL
  Filled 2022-04-30: qty 2

## 2022-04-30 MED ORDER — TETANUS-DIPHTH-ACELL PERTUSSIS 5-2.5-18.5 LF-MCG/0.5 IM SUSY: 0.5000 mL | PREFILLED_SYRINGE | Freq: Once | INTRAMUSCULAR | Status: DC

## 2022-04-30 MED ORDER — PRENATAL MULTIVITAMIN CH: 1.0000 | ORAL_TABLET | Freq: Every day | ORAL | Status: DC

## 2022-04-30 MED ORDER — TERBUTALINE SULFATE 1 MG/ML IJ SOLN: 0.2500 mg | Freq: Once | INTRAMUSCULAR | Status: DC | PRN

## 2022-04-30 MED ORDER — ACETAMINOPHEN 500 MG PO TABS
1000.0000 mg | ORAL_TABLET | Freq: Four times a day (QID) | ORAL | Status: DC
  Filled 2022-04-30: qty 2

## 2022-04-30 MED ORDER — BISACODYL 10 MG RE SUPP: 10.0000 mg | Freq: Every day | RECTAL | Status: DC | PRN

## 2022-04-30 MED ORDER — IBUPROFEN 600 MG PO TABS
600.0000 mg | ORAL_TABLET | Freq: Four times a day (QID) | ORAL | Status: DC
  Administered 2022-04-30 – 2022-05-01 (×2): 600 mg via ORAL
  Filled 2022-04-30 (×2): qty 1

## 2022-04-30 MED ORDER — ACETAMINOPHEN 325 MG PO TABS: 650.0000 mg | ORAL_TABLET | ORAL | Status: DC | PRN

## 2022-04-30 MED ORDER — IBUPROFEN 800 MG PO TABS: 800.0000 mg | ORAL_TABLET | ORAL | Status: DC

## 2022-04-30 MED ORDER — SIMETHICONE 80 MG PO CHEW: 80.0000 mg | CHEWABLE_TABLET | ORAL | Status: DC | PRN

## 2022-05-01 LAB — CBC
HCT: 33.5 % — ABNORMAL LOW (ref 36.0–46.0)
Hemoglobin: 10.9 g/dL — ABNORMAL LOW (ref 12.0–15.0)
MCH: 28.5 pg (ref 26.0–34.0)
MCHC: 32.5 g/dL (ref 30.0–36.0)
MCV: 87.5 fL (ref 80.0–100.0)
Platelets: 232 10*3/uL (ref 150–400)
RBC: 3.83 MIL/uL — ABNORMAL LOW (ref 3.87–5.11)
RDW: 13.2 % (ref 11.5–15.5)
WBC: 16.6 10*3/uL — ABNORMAL HIGH (ref 4.0–10.5)
nRBC: 0 % (ref 0.0–0.2)

## 2022-05-04 ENCOUNTER — Inpatient Hospital Stay (HOSPITAL_COMMUNITY): Admit: 2022-05-04 | Payer: Self-pay

## 2022-05-09 ENCOUNTER — Telehealth (HOSPITAL_COMMUNITY): Payer: Self-pay | Admitting: *Deleted

## 2022-05-09 NOTE — Telephone Encounter (Signed)
Left phone voicemail message.  Duffy Rhody, RN 05-09-2022 at 11:08am
